# Patient Record
Sex: Male | Born: 1960 | Race: White | Hispanic: No | Marital: Married | Smoking: Never smoker
Health system: Southern US, Community
[De-identification: ages and names within clinical notes are randomized; demographics above are authoritative.]

## PROBLEM LIST (undated history)

## (undated) DIAGNOSIS — I10 Essential (primary) hypertension: Secondary | ICD-10-CM

---

## 2016-01-25 LAB — PULMONARY FUNCTION TEST

## 2018-02-18 LAB — PULMONARY FUNCTION TEST

## 2019-11-23 LAB — PULMONARY FUNCTION TEST

## 2019-12-30 ENCOUNTER — Other Ambulatory Visit: Payer: Self-pay

## 2019-12-30 ENCOUNTER — Encounter (HOSPITAL_BASED_OUTPATIENT_CLINIC_OR_DEPARTMENT_OTHER): Payer: Self-pay | Admitting: Emergency Medicine

## 2019-12-30 ENCOUNTER — Emergency Department (HOSPITAL_BASED_OUTPATIENT_CLINIC_OR_DEPARTMENT_OTHER)
Admission: EM | Admit: 2019-12-30 | Discharge: 2019-12-31 | Disposition: A | Discharge status: Home / Self Care | Payer: 59 | Attending: Emergency Medicine | Admitting: Emergency Medicine

## 2019-12-30 DIAGNOSIS — S51851A Open bite of right forearm, initial encounter: Secondary | ICD-10-CM | POA: Insufficient documentation

## 2019-12-30 DIAGNOSIS — S61451A Open bite of right hand, initial encounter: Secondary | ICD-10-CM | POA: Diagnosis not present

## 2019-12-30 DIAGNOSIS — Z23 Encounter for immunization: Secondary | ICD-10-CM | POA: Insufficient documentation

## 2019-12-30 DIAGNOSIS — W540XXA Bitten by dog, initial encounter: Secondary | ICD-10-CM | POA: Diagnosis not present

## 2019-12-30 DIAGNOSIS — Z2914 Encounter for prophylactic rabies immune globin: Secondary | ICD-10-CM | POA: Diagnosis not present

## 2019-12-30 MED ORDER — AMOXICILLIN-POT CLAVULANATE 875-125 MG PO TABS
1.0000 | ORAL_TABLET | Freq: Once | ORAL | Status: AC
  Administered 2019-12-30: 1 via ORAL
  Filled 2019-12-30: qty 1

## 2019-12-30 MED ORDER — LIDOCAINE-EPINEPHRINE 2 %-1:100000 IJ SOLN: 20.0000 mL | Freq: Once | INTRAMUSCULAR | Status: DC

## 2019-12-30 MED ORDER — RABIES VACCINE, PCEC IM SUSR
1.0000 mL | Freq: Once | INTRAMUSCULAR | Status: AC
  Administered 2019-12-30: 1 mL via INTRAMUSCULAR
  Filled 2019-12-30: qty 1

## 2019-12-30 MED ORDER — LIDOCAINE-EPINEPHRINE (PF) 2 %-1:200000 IJ SOLN
INTRAMUSCULAR | Status: AC
  Administered 2019-12-30: 20 mL
  Filled 2019-12-30: qty 20

## 2019-12-30 MED ORDER — RABIES IMMUNE GLOBULIN 150 UNIT/ML IM INJ
20.0000 [IU]/kg | INJECTION | Freq: Once | INTRAMUSCULAR | Status: AC
  Administered 2019-12-30: 1725 [IU] via INTRAMUSCULAR
  Filled 2019-12-30: qty 12

## 2019-12-30 MED ORDER — TETANUS-DIPHTH-ACELL PERTUSSIS 5-2.5-18.5 LF-MCG/0.5 IM SUSP
0.5000 mL | Freq: Once | INTRAMUSCULAR | Status: AC
  Administered 2019-12-30: 0.5 mL via INTRAMUSCULAR
  Filled 2019-12-30: qty 0.5

## 2019-12-30 NOTE — ED Notes (Signed)
Pt moved to FT, son stated he was c/o dizziness. Pt assisted to FT stretcher and VS checked. BP readings 80s/50s. Manual BP taken 84/56, HR 65. Pt moved to TX room and laid flat. Bandage with minimal bleed though.

## 2019-12-30 NOTE — ED Triage Notes (Addendum)
Pt was bit on RFA and wrist by his own dog while trying to give it medicine ~1700. Wound wrapped by his son. Lac to anterior wrist that is oozing, puncture wound to posterior FA that is not bleeding. Wound rewrapped with telfa and kerlex. Pt able to close fist and move all fingers. Unsure of last tetanus. Dog has had some rabies vaccine but unsure if he is UTD.

## 2019-12-31 MED ORDER — AMOXICILLIN-POT CLAVULANATE 875-125 MG PO TABS: 1.0000 | ORAL_TABLET | Freq: Two times a day (BID) | ORAL | 0 refills | Status: DC

## 2019-12-31 NOTE — ED Provider Notes (Signed)
[facility] EMERGENCY DEPARTMENT Provider Note   CSN: [id number] Arrival date & time: 12/30/19  1851     History Chief Complaint  Patient presents with  . Animal Bite    Alan Rodriguez is a 59 y.o. male.  HPI     This is a 59 year old male who presents with a dog bite to the right arm and hand.  He is right-handed.  He reports that the dog is his own.  He was giving him flea prevention when the dog bit him.  He does report that the dog is mostly outside.  They are unsure if he is up-to-date on his rabies vaccination.  His collar says 2020.  They report that they noted that the dog was vomiting.  They request rabies prophylaxis.  Unknown last tetanus shot.  He denies any significant pain in his hand or arm.  History reviewed. No pertinent past medical history.  There are no problems to display for this patient.   History reviewed. No pertinent surgical history.     No family history on file.  Social History   Tobacco Use  . Smoking status: Never Smoker  . Smokeless tobacco: Never Used  Substance Use Topics  . Alcohol use: Yes    Comment: once a week  . Drug use: Never    Home Medications Prior to Admission medications   Medication Sig Start Date End Date Taking? Authorizing Provider  lisinopril (ZESTRIL) 20 MG tablet Take 20 mg by mouth daily.   Yes [provider]  amoxicillin-clavulanate (AUGMENTIN) 875-125 MG tablet Take 1 tablet by mouth every 12 (twelve) hours. 12/31/19   Tayven Renteria, Mayer Masker, MD    Allergies    Patient has no known allergies.  Review of Systems   Review of Systems  Constitutional: Negative for fever.  Skin: Positive for wound. Negative for color change.    Physical Exam Updated Vital Signs BP (!) 142/79   Pulse 69   Temp 98.9 F (37.2 C) (Oral)   Resp (!) 24   Ht 1.753 m (5\' 9" )   Wt 86.2 kg   SpO2 95%   BMI 28.06 kg/m   Physical Exam Vitals and nursing note reviewed.  Constitutional:      Appearance: He  is well-developed. He is not ill-appearing.  HENT:     Head: Normocephalic and atraumatic.     Mouth/Throat:     Mouth: Mucous membranes are moist.  Eyes:     Pupils: Pupils are equal, round, and reactive to light.  Cardiovascular:     Rate and Rhythm: Normal rate and regular rhythm.  Pulmonary:     Effort: Pulmonary effort is normal. No respiratory distress.     Breath sounds: Normal breath sounds. No wheezing.  Musculoskeletal:     Cervical back: Neck supple.     Comments: Focused examination of the right hand and forearm without obvious deformity, multiple less than 1 cm puncture wounds over the forearm, there is a larger 4 cm laceration over the radial aspect of the wrist on the palmar side, slight oozing noted, mildly gaping, ulnar, median, and radial nerves intact, flexion and extension intact of all 5 digits, 2+ radial pulse  Skin:    General: Skin is warm and dry.  Neurological:     Mental Status: He is alert and oriented to person, place, and time.  Psychiatric:        Mood and Affect: Mood normal.     ED Results / Procedures /  Treatments   Labs (all labs ordered are listed, but only abnormal results are displayed) Labs Reviewed - No data to display  EKG None  Radiology No results found.  Procedures .Marland KitchenLaceration Repair  Date/Time: 12/31/2019 1:13 AM Performed by: Shon Baton, MD Authorized by: Shon Baton, MD   Consent:    Consent obtained:  Verbal   Consent given by:  Patient   Risks discussed:  Infection and pain   Alternatives discussed:  No treatment Anesthesia (see MAR for exact dosages):    Anesthesia method:  Local infiltration   Local anesthetic:  Lidocaine 1% WITH epi Laceration details:    Location:  Hand   Hand location:  R wrist   Length (cm):  4   Depth (mm):  5 Repair type:    Repair type:  Simple Pre-procedure details:    Preparation:  Patient was prepped and draped in usual sterile fashion Exploration:    Hemostasis  achieved with:  Direct pressure   Wound exploration: wound explored through full range of motion     Wound extent: no muscle damage noted, no nerve damage noted, no tendon damage noted, no underlying fracture noted and no vascular damage noted   Treatment:    Area cleansed with:  Betadine   Amount of cleaning:  Standard   Irrigation solution:  Sterile saline   Irrigation volume:  1000   Irrigation method:  Pressure wash   Visualized foreign bodies/material removed: no   Skin repair:    Repair method:  Sutures   Suture size:  4-0   Suture material:  Nylon   Suture technique:  Horizontal mattress   Number of sutures:  1 Approximation:    Approximation:  Loose Post-procedure details:    Dressing:  Open (no dressing)   Patient tolerance of procedure:  Tolerated well, no immediate complications Comments:     Wound was approximated loosely to help with hemostasis,   (including critical care time)  Medications Ordered in ED Medications  lidocaine-EPINEPHrine (XYLOCAINE W/EPI) 2 %-1:100000 (with pres) injection 20 mL (20 mLs Infiltration Not Given 12/30/19 2347)  amoxicillin-clavulanate (AUGMENTIN) 875-125 MG per tablet 1 tablet (1 tablet Oral Given 12/30/19 2333)  Tdap (BOOSTRIX) injection 0.5 mL (0.5 mLs Intramuscular Given 12/30/19 2333)  rabies vaccine (RABAVERT) injection 1 mL (1 mL Intramuscular Given 12/30/19 2334)  rabies immune globulin (HYPERAB/KEDRAB) injection 1,725 Units (1,725 Units Intramuscular Given 12/30/19 2336)  lidocaine-EPINEPHrine (XYLOCAINE W/EPI) 2 %-1:200000 (PF) injection (20 mLs  Given by Other 12/30/19 2339)    ED Course  I have reviewed the triage vital signs and the nursing notes.  Pertinent labs & imaging results that were available during my care of the patient were reviewed by me and considered in my medical decision making (see chart for details).    MDM Rules/Calculators/A&P                           Patient presents with a dog bite to the hand.  He  is overall nontoxic and vital signs are reassuring.  Multiple small puncture wounds and one larger laceration over the wrist.  He is neurovascularly intact.  He is requesting rabies prophylaxis.  We discussed ongoing vaccinations and quarantine the animal for signs and symptoms of rabies.  They stated understanding.  Tetanus was updated.  He was given a dose of Augmentin.  He will be discharged on Augmentin.  Larger laceration was loosely approximated to help with hemostasis.  He was given precautions regarding signs and symptoms of infection.  After history, exam, and medical workup I feel the patient has been appropriately medically screened and is safe for discharge home. Pertinent diagnoses were discussed with the patient. Patient was given return precautions.   Final Clinical Impression(s) / ED Diagnoses Final diagnoses:  Dog bite, initial encounter    Rx / DC Orders ED Discharge Orders         Ordered    amoxicillin-clavulanate (AUGMENTIN) 875-125 MG tablet  Every 12 hours        12/31/19 0110           Zareen Jamison, Mayer Masker, MD 12/31/19 [phone]

## 2019-12-31 NOTE — Discharge Instructions (Addendum)
You are seen today for an animal bite.  You were given tetanus and rabies vaccination and immunoglobulin.  Follow-up with your primary doctor for wound check this week.  You will need to have the stitch removed in 5 to 7 days.  Monitor closely for signs and symptoms of infection.  Take medications as prescribed.  See scheduling for further rabies vaccination provided by nursing.  If the dog can be quarantined and observed for 10 days, you may avoid additional vaccination.

## 2020-01-02 ENCOUNTER — Encounter: Payer: Self-pay | Admitting: *Deleted

## 2020-01-02 ENCOUNTER — Other Ambulatory Visit: Payer: Self-pay

## 2020-01-02 ENCOUNTER — Emergency Department
Admission: EM | Admit: 2020-01-02 | Discharge: 2020-01-02 | Disposition: A | Discharge status: Home / Self Care | Payer: 59 | Source: Home / Self Care

## 2020-01-02 DIAGNOSIS — Z23 Encounter for immunization: Secondary | ICD-10-CM

## 2020-01-02 HISTORY — DX: Essential (primary) hypertension: I10

## 2020-01-02 MED ORDER — RABIES VACCINE, PCEC IM SUSR
1.0000 mL | Freq: Once | INTRAMUSCULAR | Status: AC
  Administered 2020-01-02: 1 mL via INTRAMUSCULAR

## 2020-01-02 NOTE — ED Triage Notes (Signed)
Pt is here today for his 2nd rabies vaccine post ED visit on 12/30/19.

## 2020-01-03 ENCOUNTER — Encounter: Payer: Self-pay | Admitting: Family Medicine

## 2020-01-03 ENCOUNTER — Ambulatory Visit (INDEPENDENT_AMBULATORY_CARE_PROVIDER_SITE_OTHER): Payer: 59 | Admitting: Family Medicine

## 2020-01-03 VITALS — BP 136/65 | HR 65 | Ht 70.0 in | Wt 196.0 lb

## 2020-01-03 DIAGNOSIS — S61511A Laceration without foreign body of right wrist, initial encounter: Secondary | ICD-10-CM | POA: Diagnosis not present

## 2020-01-03 DIAGNOSIS — E785 Hyperlipidemia, unspecified: Secondary | ICD-10-CM | POA: Diagnosis not present

## 2020-01-03 DIAGNOSIS — Z833 Family history of diabetes mellitus: Secondary | ICD-10-CM

## 2020-01-03 DIAGNOSIS — I10 Essential (primary) hypertension: Secondary | ICD-10-CM | POA: Diagnosis not present

## 2020-01-03 DIAGNOSIS — W540XXA Bitten by dog, initial encounter: Secondary | ICD-10-CM | POA: Insufficient documentation

## 2020-01-03 NOTE — Assessment & Plan Note (Signed)
BP little elevated today did encourage him to take the blood pressure pill regularly and if he has any side effects let me know.  I suspect we may need to actually decrease his dose if he is taking it consistently.

## 2020-01-03 NOTE — Assessment & Plan Note (Addendum)
Alan Rodriguez has significant swelling over the right forearm but most of the wounds are healing well.  Recommend switch to Vaseline instead of bacitracin ointment.  Come back in about 6 days for suture removal for the laceration at the wrist.  He Alan Rodriguez complete his antibiotics over the weekends if at any point he notices increased swelling or redness after completing the antibiotics please give [place] call back.  K to keep uncovered and was working outside or could possibly get dirty in which case he Alan Rodriguez need to cover it temporarily.

## 2020-01-03 NOTE — Progress Notes (Addendum)
New Patient Office Visit  Subjective:  Patient ID: Alan Rodriguez, male    DOB: 09-30-1960  Age: 59 y.o. MRN: [id number]  CC:  Chief Complaint  Patient presents with  . Establish Care    HPI Alan Rodriguez presents for follow-up wound care for dog bite on his right arm and hand.  It occurred on 10 3 requiring irrigation and sutures.  No significant injury to the tendons nerves.  He is still on the Augmentin and has a few days left.  He is not having any active drainage from the wounds.  He has been cleaning it with antibacterial soap and then applying bacitracin ointment.  He is also transferring care here from Alan Rodriguez.  He has a history of hypertension but admits he really does not take his blood pressure pill regularly.  He tries to do dietary measures to improve his blood pressure though he has cut back on salt.  He reports that his home blood pressures have actually looked pretty good with his home monitor.  History of hyperlipidemia-not currently on a statin.  He just says he had labs done recently so we Alan Rodriguez get those transferred medical records and get those entered into the system.  Had elevated cholesterol for years.  Family history of diabetes in both parents.  He says he does get screened periodically for diabetes.  Past Medical History:  Diagnosis Date  . Hypertension     History reviewed. No pertinent surgical history.  Family History  Problem Relation Age of Onset  . Diabetes Mother   . Diabetes Father     Social History   Socioeconomic History  . Marital status: Married    Spouse name: Not on file  . Number of children: Not on file  . Years of education: Not on file  . Highest education level: Not on file  Occupational History  . Not on file  Tobacco Use  . Smoking status: Never Smoker  . Smokeless tobacco: Never Used  Vaping Use  . Vaping Use: Never used  Substance and Sexual Activity  . Alcohol use: Yes    Comment: once a week  .  Drug use: Never  . Sexual activity: Not on file  Other Topics Concern  . Not on file  Social History Narrative  . Not on file   Social Determinants of Health   Financial Resource Strain:   . Difficulty of Paying Living Expenses: Not on file  Food Insecurity:   . Worried About Alan Rodriguez: Not on file  . Ran Out of Food in the Last Rodriguez: Not on file  Transportation Needs:   . Lack of Transportation (Medical): Not on file  . Lack of Transportation (Non-Medical): Not on file  Physical Activity:   . Days of Exercise per Week: Not on file  . Minutes of Exercise per Session: Not on file  Stress:   . Feeling of Stress : Not on file  Social Connections:   . Frequency of Communication with Friends and Family: Not on file  . Frequency of Social Gatherings with Friends and Family: Not on file  . Attends Religious Services: Not on file  . Active Member of Clubs or Organizations: Not on file  . Attends Banker Meetings: Not on file  . Marital Status: Not on file  Intimate Partner Violence:   . Fear of Current or Ex-Partner: Not on file  . Emotionally Abused: Not on file  .  Physically Abused: Not on file  . Sexually Abused: Not on file    ROS Review of Systems  Constitutional: Negative for diaphoresis, fever and unexpected weight change.  HENT: Negative for hearing loss, rhinorrhea, sneezing and tinnitus.   Eyes: Negative for visual disturbance.  Respiratory: Negative for cough and wheezing.   Cardiovascular: Negative for chest pain and palpitations.  Gastrointestinal: Negative for blood in stool, diarrhea, nausea and vomiting.  Genitourinary: Negative for discharge and dysuria.  Musculoskeletal: Negative for arthralgias and myalgias.  Skin: Negative for rash.  Neurological: Negative for headaches.  Hematological: Negative for adenopathy.  Psychiatric/Behavioral: Negative for dysphoric mood and sleep disturbance. The patient is not  nervous/anxious.     Objective:   Today's Vitals: BP 136/65   Pulse 65   Ht 5\' 10"  (1.778 m)   Wt 196 lb (88.9 kg)   SpO2 99%   BMI 28.12 kg/m   Physical Exam Constitutional:      Appearance: He is well-developed.  HENT:     Head: Normocephalic and atraumatic.  Cardiovascular:     Rate and Rhythm: Normal rate and regular rhythm.     Heart sounds: Normal heart sounds.  Pulmonary:     Effort: Pulmonary effort is normal.     Breath sounds: Normal breath sounds.  Skin:    General: Skin is warm and dry.  Neurological:     Mental Status: He is alert and oriented to person, place, and time.  Psychiatric:        Behavior: Behavior normal.     Assessment & Plan:   Problem List Items Addressed This Visit      Cardiovascular and Mediastinum   Essential hypertension    BP little elevated today did encourage him to take the blood pressure pill regularly and if he has any side effects let me know.  I suspect we may need to actually decrease his dose if he is taking it consistently.        Other   Hyperlipidemia    Alan Rodriguez get copy of lipids.        Family history of diabetes mellitus in first degree relative   Dog bite - Primary    Alan Rodriguez has significant swelling over the right forearm but most of the wounds are healing well.  Recommend switch to Vaseline instead of bacitracin ointment.  Come back in about 6 days for suture removal for the laceration at the wrist.  He Alan Rodriguez complete his antibiotics over the weekends if at any point he notices increased swelling or redness after completing the antibiotics please give call back.  Alan Rodriguez to keep uncovered and was working outside or could possibly get dirty in which case he Alan Rodriguez need to cover it temporarily.       Other Visit Diagnoses    Laceration of right wrist, initial encounter          Outpatient Encounter Medications as of 01/03/2020  Medication Sig  . [DISCONTINUED] amoxicillin-clavulanate (AUGMENTIN) 875-125 MG tablet Take  1 tablet by mouth every 12 (twelve) hours.  . [DISCONTINUED] lisinopril (ZESTRIL) 20 MG tablet Take 20 mg by mouth daily.   No facility-administered encounter medications on file as of 01/03/2020.    Declines flu vaccine today.  Follow-up: Return in about 6 days (around 01/09/2020) for Suture removal only.   01/11/2020, MD

## 2020-01-03 NOTE — Assessment & Plan Note (Signed)
Will get copy of lipids.

## 2020-01-05 ENCOUNTER — Other Ambulatory Visit: Payer: Self-pay

## 2020-01-05 ENCOUNTER — Emergency Department (INDEPENDENT_AMBULATORY_CARE_PROVIDER_SITE_OTHER)
Admission: EM | Admit: 2020-01-05 | Discharge: 2020-01-05 | Disposition: A | Discharge status: Home / Self Care | Payer: 59 | Source: Home / Self Care

## 2020-01-05 ENCOUNTER — Encounter: Payer: Self-pay | Admitting: Emergency Medicine

## 2020-01-05 DIAGNOSIS — Z23 Encounter for immunization: Secondary | ICD-10-CM

## 2020-01-05 MED ORDER — RABIES VACCINE, PCEC IM SUSR
1.0000 mL | Freq: Once | INTRAMUSCULAR | Status: AC
  Administered 2020-01-05: 1 mL via INTRAMUSCULAR

## 2020-01-05 NOTE — ED Triage Notes (Signed)
Patient bitten by his own dog 10/3/211 on right lower arm; here for 2nd post ER visit rabies vaccination. States no problem with other doses. Is on antibiotics and was seen by PCP for wound evaluation earlier this week.

## 2020-01-10 ENCOUNTER — Encounter: Payer: Self-pay | Admitting: Family Medicine

## 2020-01-10 ENCOUNTER — Other Ambulatory Visit: Payer: Self-pay

## 2020-01-10 ENCOUNTER — Ambulatory Visit (INDEPENDENT_AMBULATORY_CARE_PROVIDER_SITE_OTHER): Payer: 59 | Admitting: Family Medicine

## 2020-01-10 VITALS — BP 127/73 | HR 61 | Ht 70.0 in | Wt 194.0 lb

## 2020-01-10 DIAGNOSIS — I10 Essential (primary) hypertension: Secondary | ICD-10-CM | POA: Diagnosis not present

## 2020-01-10 DIAGNOSIS — W540XXD Bitten by dog, subsequent encounter: Secondary | ICD-10-CM

## 2020-01-10 MED ORDER — LISINOPRIL 10 MG PO TABS: 10.0000 mg | ORAL_TABLET | Freq: Every day | ORAL | 1 refills | Status: DC

## 2020-01-10 NOTE — Assessment & Plan Note (Signed)
Wound is healing well.  Suture removed easily.  For swelling on forearm, will schedule [place] next week if not improving.  Still tender but no active drainge.

## 2020-01-10 NOTE — Progress Notes (Signed)
Acute Office Visit  Subjective:    Patient ID: Alan Rodriguez, male    DOB: [birth year], 59 y.o.   MRN: [id number]  No chief complaint on file.   HPI Patient is in today for f/u HTN.  Did start half a tab of 20 mg dailly . Tolerating well with no side effects.    Here for suture removal. He is doing well. No drainage from the wound.  Has a suture to remove.  The swelling on the dorsum of the forearm has been getting better but still some swollen. Some pain in this area with full flexion of the wrist.   Past Medical History:  Diagnosis Date  . Hypertension     No past surgical history on file.  No family history on file.  Social History   Socioeconomic History  . Marital status: Married    Spouse name: Not on file  . Number of children: Not on file  . Years of education: Not on file  . Highest education level: Not on file  Occupational History  . Not on file  Tobacco Use  . Smoking status: Never Smoker  . Smokeless tobacco: Never Used  Vaping Use  . Vaping Use: Never used  Substance and Sexual Activity  . Alcohol use: Yes    Comment: once a week  . Drug use: Never  . Sexual activity: Not on file  Other Topics Concern  . Not on file  Social History Narrative  . Not on file   Social Determinants of Health   Financial Resource Strain:   . Difficulty of Paying Living Expenses: Not on file  Food Insecurity:   . Worried About Charity fundraiser in the Last Year: Not on file  . Ran Out of Food in the Last Year: Not on file  Transportation Needs:   . Lack of Transportation (Medical): Not on file  . Lack of Transportation (Non-Medical): Not on file  Physical Activity:   . Days of Exercise per Week: Not on file  . Minutes of Exercise per Session: Not on file  Stress:   . Feeling of Stress : Not on file  Social Connections:   . Frequency of Communication with Friends and Family: Not on file  . Frequency of Social Gatherings with Friends and Family: Not on  file  . Attends Religious Services: Not on file  . Active Member of Clubs or Organizations: Not on file  . Attends Archivist Meetings: Not on file  . Marital Status: Not on file  Intimate Partner Violence:   . Fear of Current or Ex-Partner: Not on file  . Emotionally Abused: Not on file  . Physically Abused: Not on file  . Sexually Abused: Not on file    Outpatient Medications Prior to Visit  Medication Sig Dispense Refill  . amoxicillin-clavulanate (AUGMENTIN) 875-125 MG tablet Take 1 tablet by mouth every 12 (twelve) hours. 14 tablet 0  . lisinopril (ZESTRIL) 20 MG tablet Take 20 mg by mouth daily.     No facility-administered medications prior to visit.    No Known Allergies  Review of Systems     Objective:    Physical Exam Constitutional:      Appearance: He is well-developed.  HENT:     Head: Normocephalic and atraumatic.  Cardiovascular:     Rate and Rhythm: Normal rate and regular rhythm.     Heart sounds: Normal heart sounds.  Pulmonary:     Effort: Pulmonary effort is  normal.     Breath sounds: Normal breath sounds.  Skin:    General: Skin is warm and dry.  Neurological:     Mental Status: He is alert and oriented to person, place, and time.  Psychiatric:        Behavior: Behavior normal.     BP 127/73   Pulse 61   Ht 5' 10" (1.778 m)   Wt 194 lb (88 kg)   BMI 27.84 kg/m  Wt Readings from Last 3 Encounters:  01/10/20 194 lb (88 kg)  01/03/20 196 lb (88.9 kg)  01/02/20 190 lb (86.2 kg)    Health Maintenance Due  Topic Date Due  . Hepatitis C Screening  Never done  . HIV Screening  Never done    There are no preventive care reminders to display for this patient.   No results found for: TSH No results found for: WBC, HGB, HCT, MCV, PLT No results found for: NA, K, CHLORIDE, CO2, GLUCOSE, BUN, CREATININE, BILITOT, ALKPHOS, AST, ALT, PROT, ALBUMIN, CALCIUM, ANIONGAP, EGFR, GFR No results found for: CHOL No results found for:  HDL No results found for: LDLCALC No results found for: TRIG No results found for: CHOLHDL No results found for: HGBA1C     Assessment & Plan:   Problem List Items Addressed This Visit      Cardiovascular and Mediastinum   Essential hypertension - Primary    Well controlled. Continue current regimen. Follow up in  6 months. Will verify labs once get medical records.       Relevant Medications   lisinopril (ZESTRIL) 10 MG tablet     Other   Dog bite    Wound is healing well.  Suture removed easily.  For swelling on forearm, will schedule [place] next week if not improving.  Still tender but no active drainge.            Meds ordered this encounter  Medications  . DISCONTD: lisinopril (ZESTRIL) 10 MG tablet    Sig: Take 1 tablet (10 mg total) by mouth daily.    Dispense:  90 tablet    Refill:  1  . lisinopril (ZESTRIL) 10 MG tablet    Sig: Take 1 tablet (10 mg total) by mouth daily.    Dispense:  90 tablet    Refill:  1     Beatrice Lecher, MD

## 2020-01-10 NOTE — Assessment & Plan Note (Signed)
Well controlled. Continue current regimen. Follow up in  6 months. Will verify labs once get medical records.

## 2020-01-12 ENCOUNTER — Other Ambulatory Visit: Payer: Self-pay

## 2020-01-12 ENCOUNTER — Emergency Department (INDEPENDENT_AMBULATORY_CARE_PROVIDER_SITE_OTHER)
Admission: EM | Admit: 2020-01-12 | Discharge: 2020-01-12 | Disposition: A | Discharge status: Home / Self Care | Payer: 59 | Source: Home / Self Care

## 2020-01-12 DIAGNOSIS — Z23 Encounter for immunization: Secondary | ICD-10-CM

## 2020-01-12 MED ORDER — RABIES VACCINE, PCEC IM SUSR
1.0000 mL | Freq: Once | INTRAMUSCULAR | Status: AC
  Administered 2020-01-12: 1 mL via INTRAMUSCULAR

## 2020-01-12 NOTE — ED Triage Notes (Signed)
Here for 4th in series of rabies shot - given in left arm - pt denies adverse reactions w/ other rabies series

## 2020-01-26 ENCOUNTER — Emergency Department (INDEPENDENT_AMBULATORY_CARE_PROVIDER_SITE_OTHER)
Admission: EM | Admit: 2020-01-26 | Discharge: 2020-01-26 | Disposition: A | Discharge status: Home / Self Care | Payer: 59 | Source: Home / Self Care

## 2020-01-26 DIAGNOSIS — Z23 Encounter for immunization: Secondary | ICD-10-CM

## 2020-01-26 MED ORDER — RABIES VACCINE, PCEC IM SUSR
1.0000 mL | Freq: Once | INTRAMUSCULAR | Status: AC
  Administered 2020-01-26: 1 mL via INTRAMUSCULAR

## 2020-01-26 NOTE — ED Triage Notes (Signed)
Here for last in series of rabies injection

## 2020-05-30 ENCOUNTER — Telehealth: Payer: Self-pay | Admitting: Family Medicine

## 2020-05-30 NOTE — Telephone Encounter (Signed)
Please call patient to see where he may have had the colonoscopy performed.  The report says that it was done in 2016 but I do not see it in care everywhere and will need to try to get a copy of that if possible.

## 2020-05-30 NOTE — Telephone Encounter (Signed)
Called pt, he stated that he had the colonoscopy done in [place] and cannot remember the Dr or facility name. He stated when he gets home he will look for the documents regarding the colonoscopy and let us know.

## 2020-07-11 ENCOUNTER — Ambulatory Visit: Payer: 59 | Admitting: Family Medicine

## 2020-07-18 ENCOUNTER — Ambulatory Visit: Payer: 59 | Admitting: Family Medicine

## 2020-12-26 ENCOUNTER — Encounter: Payer: 59 | Admitting: Family Medicine

## 2020-12-26 ENCOUNTER — Ambulatory Visit (INDEPENDENT_AMBULATORY_CARE_PROVIDER_SITE_OTHER): Payer: 59 | Admitting: Family Medicine

## 2020-12-26 ENCOUNTER — Other Ambulatory Visit: Payer: Self-pay

## 2020-12-26 ENCOUNTER — Encounter: Payer: Self-pay | Admitting: Family Medicine

## 2020-12-26 VITALS — BP 115/89 | HR 66 | Temp 98.5°F | Wt 200.0 lb

## 2020-12-26 DIAGNOSIS — E782 Mixed hyperlipidemia: Secondary | ICD-10-CM

## 2020-12-26 DIAGNOSIS — Z0000 Encounter for general adult medical examination without abnormal findings: Secondary | ICD-10-CM | POA: Diagnosis not present

## 2020-12-26 DIAGNOSIS — Z1211 Encounter for screening for malignant neoplasm of colon: Secondary | ICD-10-CM | POA: Diagnosis not present

## 2020-12-26 DIAGNOSIS — I10 Essential (primary) hypertension: Secondary | ICD-10-CM | POA: Diagnosis not present

## 2020-12-26 MED ORDER — LISINOPRIL 10 MG PO TABS: 10.0000 mg | ORAL_TABLET | Freq: Every day | ORAL | 1 refills | Status: DC

## 2020-12-26 NOTE — Patient Instructions (Signed)
-   BP goal <130/80 - monitor and log blood pressures at home - check around the same time each day in a relaxed setting - Limit salt to <2000 mg/day - Follow DASH eating plan (heart healthy diet) - limit alcohol to 2 standard drinks per day for men and 1 per day for women - avoid tobacco products - get at least 2 hours of regular aerobic exercise weekly Patient aware of signs/symptoms requiring further/urgent evaluation. Labs updated today.  

## 2020-12-26 NOTE — Progress Notes (Signed)
BP 115/89 (BP Location: Left Arm, Patient Position: Sitting, Cuff Size: Normal)   Pulse 66   Temp 98.5 F (36.9 C) (Oral)   Wt 200 lb 0.6 oz (90.7 kg)   BMI 28.70 kg/m    Subjective:    Patient ID: Alan Rodriguez, male    DOB: October 24, 1960, 60 y.o.   MRN: [id number]  HPI: Alan Rodriguez is a 60 y.o. male presenting on 12/26/2020 for comprehensive medical examination. Current medical complaints include:none  He currently lives with: wife Interim Problems from his last visit: no  He reports regular vision exams q1-5y: yes He reports regular dental exams q 46m: yes His diet consists of: home cooking, fruits, vegetables He endorses exercise and/or activity of: walking, house work  He works at: [employer]  He endorses ETOH use - 3-4 servings a week He denies nictoine use  He denies illegal substance use   He is currently sexually active with wife He denies concerns today about STI  He denies concerns about skin changes today:  He denies concerns about bowel changes today:  He denies concerns about bladder changes today:   Depression Screen done today and results listed below:  Depression screen [facility] 2/9 12/26/2020 01/03/2020  Decreased Interest 0 0  Down, Depressed, Hopeless 0 0  PHQ - 2 Score 0 0    The patient does not have a history of falls.     Past Medical History:  Past Medical History:  Diagnosis Date   Hypertension     Surgical History:  History reviewed. No pertinent surgical history.  Medications:  No current outpatient medications on file prior to visit.   No current facility-administered medications on file prior to visit.    Allergies:  Allergies  Allergen Reactions   Orange Peel [Orange Oil] Dermatitis    Social History:  Social History   Socioeconomic History   Marital status: Married    Spouse name: Not on file   Number of children: Not on file   Years of education: Not on file   Highest education level: Not on file   Occupational History   Not on file  Tobacco Use   Smoking status: Never   Smokeless tobacco: Never  Vaping Use   Vaping Use: Never used  Substance and Sexual Activity   Alcohol use: Yes    Comment: once a week   Drug use: Never   Sexual activity: Not on file  Other Topics Concern   Not on file  Social History Narrative   Not on file   Social Determinants of Health   Financial Resource Strain: Not on file  Food Insecurity: Not on file  Transportation Needs: Not on file  Physical Activity: Not on file  Stress: Not on file  Social Connections: Not on file  Intimate Partner Violence: Not on file   Social History   Tobacco Use  Smoking Status Never  Smokeless Tobacco Never   Social History   Substance and Sexual Activity  Alcohol Use Yes   Comment: once a week    Family History:  Family History  Problem Relation Age of Onset   Diabetes Mother    Diabetes Father     Past medical history, surgical history, medications, allergies, family history and social history reviewed with patient today and changes made to appropriate areas of the chart.   All ROS negative except what is listed above and in the HPI.      Objective:    BP 115/89 (  BP Location: Left Arm, Patient Position: Sitting, Cuff Size: Normal)   Pulse 66   Temp 98.5 F (36.9 C) (Oral)   Wt 200 lb 0.6 oz (90.7 kg)   BMI 28.70 kg/m   Wt Readings from Last 3 Encounters:  12/26/20 200 lb 0.6 oz (90.7 kg)  01/10/20 194 lb (88 kg)  01/03/20 196 lb (88.9 kg)    Physical Exam Vitals reviewed.  Constitutional:      Appearance: Normal appearance. He is normal weight.  HENT:     Head: Normocephalic and atraumatic.     Right Ear: Tympanic membrane normal.     Left Ear: Tympanic membrane normal.     Nose: Nose normal.     Mouth/Throat:     Mouth: Mucous membranes are moist.     Pharynx: Oropharynx is clear. No oropharyngeal exudate or posterior oropharyngeal erythema.  Cardiovascular:     Rate and  Rhythm: Normal rate and regular rhythm.     Pulses: Normal pulses.     Heart sounds: Normal heart sounds.  Pulmonary:     Effort: Pulmonary effort is normal.     Breath sounds: Normal breath sounds.  Abdominal:     General: Bowel sounds are normal.     Palpations: Abdomen is soft.  Musculoskeletal:        General: Normal range of motion.     Cervical back: Normal range of motion and neck supple.     Right lower leg: No edema.     Left lower leg: No edema.  Skin:    General: Skin is warm and dry.     Capillary Refill: Capillary refill takes less than 2 seconds.     Findings: No rash.  Neurological:     Mental Status: He is alert and oriented to person, place, and time. Mental status is at baseline.  Psychiatric:        Mood and Affect: Mood normal.        Behavior: Behavior normal.        Thought Content: Thought content normal.        Judgment: Judgment normal.    Results for orders placed or performed in visit on 11/23/19  Pulmonary Function Test  Result Value Ref Range   FEV1     FVC     FEV1/FVC     TLC     DLCO        Assessment & Plan:   Problem List Items Addressed This Visit       Cardiovascular and Mediastinum   Essential hypertension    Reports he has not been taking his lisinopril every day, only when BP is high. Home readings are typically 130/80s. Last reading today was 155/89 Blood pressure is not at goal at for age and co-morbidities.  I recommend taking lisinopril daily as prescribed.  In addition they were instructed on the following: - BP goal <130/80 - monitor and log blood pressures at home - check around the same time each day in a relaxed setting - Limit salt to <2000 mg/day - Follow DASH eating plan (heart healthy diet) - limit alcohol to 2 standard drinks per day for men and 1 per day for women - avoid tobacco products - get at least 2 hours of regular aerobic exercise weekly Patient aware of signs/symptoms requiring further/urgent  evaluation. Labs updated today. **recommend nurse visit in 2 weeks for BP check**       Relevant Medications   lisinopril (ZESTRIL) 10 MG  tablet   Other Relevant Orders   CBC   COMPLETE METABOLIC PANEL WITH GFR   Lipid panel   Other Visit Diagnoses     Preventative health care    -  Primary   Relevant Orders   Cologuard   CBC   COMPLETE METABOLIC PANEL WITH GFR   Lipid panel   Screen for colon cancer       Relevant Orders   Cologuard        LABORATORY TESTING:  Health maintenance labs ordered today as discussed above.  - STI testing: deferred   IMMUNIZATIONS:   - Tdap: Tetanus vaccination status reviewed: tetanus status unknown to the patient. - Influenza: Refused - Pneumovax: Not applicable - Prevnar: Not applicable - HPV: Not applicable - Shingrix vaccine: Refused - COVID-19: Refused  SCREENING: - Colonoscopy: Ordered today - Cologuard Discussed with patient purpose of the colonoscopy is to detect colon cancer at curable precancerous or early stages  - AAA Screening: Not applicable  -Hearing Test: Not applicable  -Spirometry: Not applicable  - Lung Cancer Screening: Not applicable    PATIENT COUNSELING:    Sexuality: Discussed sexually transmitted diseases, partner selection, use of condoms, avoidance of unintended pregnancy, and contraceptive alternatives.    I discussed with the patient that most people either abstain from alcohol or drink within safe limits (<=14/week and <=4 drinks/occasion for males, <=7/weeks and <= 3 drinks/occasion for females) and that the risk for alcohol disorders and other health effects rises proportionally with the number of drinks per week and how often a drinker exceeds daily limits.  Discussed cessation/primary prevention of drug use and availability of treatment for abuse.   Diet: Encouraged to adjust caloric intake to maintain or achieve ideal body weight, to reduce intake of dietary saturated fat and total fat, to  limit sodium intake by avoiding high sodium foods and not adding table salt, and to maintain adequate dietary potassium and calcium preferably from fresh fruits, vegetables, and low-fat dairy products.    Emphasized the importance of regular exercise  Injury prevention: Discussed safety belts, safety helmets, smoke detector, smoking near bedding or upholstery.   Dental health: Discussed importance of regular tooth brushing, flossing, and dental visits.   Follow up plan:  Return in about 2 weeks (around 01/09/2021) for nurse visit BP check.   Lollie Marrow Reola Calkins, DNP, FNP-C

## 2020-12-26 NOTE — Assessment & Plan Note (Addendum)
Reports he has not been taking his lisinopril every day, only when BP is high. Home readings are typically 130/80s. Last reading today was 155/89 Blood pressure is not at goal at for age and co-morbidities.  I recommend taking lisinopril daily as prescribed.  In addition they were instructed on the following: - BP goal <130/80 - monitor and log blood pressures at home - check around the same time each day in a relaxed setting - Limit salt to <2000 mg/day - Follow DASH eating plan (heart healthy diet) - limit alcohol to 2 standard drinks per day for men and 1 per day for women - avoid tobacco products - get at least 2 hours of regular aerobic exercise weekly Patient aware of signs/symptoms requiring further/urgent evaluation. Labs updated today. **recommend nurse visit in 2 weeks for BP check**

## 2020-12-27 LAB — COMPLETE METABOLIC PANEL WITH GFR
AG Ratio: 2 (calc) (ref 1.0–2.5)
ALT: 29 U/L (ref 9–46)
AST: 29 U/L (ref 10–35)
Albumin: 4.8 g/dL (ref 3.6–5.1)
Alkaline phosphatase (APISO): 62 U/L (ref 35–144)
BUN: 19 mg/dL (ref 7–25)
CO2: 30 mmol/L (ref 20–32)
Calcium: 9.9 mg/dL (ref 8.6–10.3)
Chloride: 101 mmol/L (ref 98–110)
Creat: 0.95 mg/dL (ref 0.70–1.30)
Globulin: 2.4 g/dL (calc) (ref 1.9–3.7)
Glucose, Bld: 103 mg/dL — ABNORMAL HIGH (ref 65–99)
Potassium: 4.3 mmol/L (ref 3.5–5.3)
Sodium: 138 mmol/L (ref 135–146)
Total Bilirubin: 0.5 mg/dL (ref 0.2–1.2)
Total Protein: 7.2 g/dL (ref 6.1–8.1)
eGFR: 93 mL/min/{1.73_m2} (ref >=60)

## 2020-12-27 LAB — CBC
HCT: 47.8 % (ref 38.5–50.0)
Hemoglobin: 16.2 g/dL (ref 13.2–17.1)
MCH: 29.8 pg (ref 27.0–33.0)
MCHC: 33.9 g/dL (ref 32.0–36.0)
MCV: 87.9 fL (ref 80.0–100.0)
MPV: 11.1 fL (ref 7.5–12.5)
Platelets: 267 10*3/uL (ref 140–400)
RBC: 5.44 10*6/uL (ref 4.20–5.80)
RDW: 12 % (ref 11.0–15.0)
WBC: 5.3 10*3/uL (ref 3.8–10.8)

## 2020-12-27 LAB — LIPID PANEL
Cholesterol: 232 mg/dL — ABNORMAL HIGH (ref <200)
HDL: 54 mg/dL (ref >=40)
LDL Cholesterol (Calc): 140 mg/dL (calc) — ABNORMAL HIGH
Non-HDL Cholesterol (Calc): 178 mg/dL (calc) — ABNORMAL HIGH (ref <130)
Total CHOL/HDL Ratio: 4.3 (calc) (ref <5.0)
Triglycerides: 237 mg/dL — ABNORMAL HIGH (ref <150)

## 2020-12-29 NOTE — Addendum Note (Signed)
Addended by: Hyman Hopes B on: 12/29/2020 08:17 AM   Modules accepted: Orders

## 2021-01-02 ENCOUNTER — Encounter: Payer: 59 | Admitting: Family Medicine

## 2021-01-17 LAB — COLOGUARD: Cologuard: NEGATIVE

## 2021-02-10 ENCOUNTER — Other Ambulatory Visit: Payer: Self-pay

## 2021-02-10 DIAGNOSIS — I10 Essential (primary) hypertension: Secondary | ICD-10-CM

## 2021-02-10 MED ORDER — LISINOPRIL 10 MG PO TABS: 10.0000 mg | ORAL_TABLET | Freq: Every day | ORAL | 1 refills | Status: DC

## 2021-03-31 ENCOUNTER — Other Ambulatory Visit: Payer: Self-pay

## 2021-03-31 ENCOUNTER — Ambulatory Visit (INDEPENDENT_AMBULATORY_CARE_PROVIDER_SITE_OTHER): Payer: 59 | Admitting: Family Medicine

## 2021-03-31 ENCOUNTER — Encounter: Payer: Self-pay | Admitting: Family Medicine

## 2021-03-31 VITALS — BP 138/83 | HR 71 | Temp 98.8°F | Ht 70.0 in | Wt 198.1 lb

## 2021-03-31 DIAGNOSIS — R079 Chest pain, unspecified: Secondary | ICD-10-CM | POA: Diagnosis not present

## 2021-03-31 DIAGNOSIS — I10 Essential (primary) hypertension: Secondary | ICD-10-CM | POA: Diagnosis not present

## 2021-03-31 MED ORDER — LISINOPRIL 20 MG PO TABS: 20.0000 mg | ORAL_TABLET | Freq: Every day | ORAL | 0 refills | Status: DC

## 2021-03-31 NOTE — Patient Instructions (Addendum)
Increasing your lisinopril to 20 mg daily. It is important that you are taking this everyday as prescribed.  Adding a cardiologist referral to further workup your chest pain - they will call you to schedule. If chest pain becomes more severe or if more symptoms develop, go to the emergency department.

## 2021-03-31 NOTE — Progress Notes (Signed)
Acute Office Visit  Subjective:    Patient ID: Alan Rodriguez, male    DOB: [birth year], 61 y.o.   MRN: [id number]  Chief Complaint  Patient presents with   Hypertension    HPI Patient is in today for hospital follow-up: HTN, chest pain.   03/30/20 ED: Patient went to the ED with home blood pressure readings of 221/130 and chest tightness. Prescribed lisinopril 10 mg daily, but reported that if his BP reading is normal at home, then he may skip it. He ended up taking 20 mg of lisinopril when he saw his BP was this high. No headaches at the time.  CXR: negative  EKG: NSR, normal axis, T wave abnormality, consider inferior ischemia, nonspecific repolarization disturbance  CBC, CMP, Magnesium: unremarkable Troponin: negative x3   Patient reports he had stopped taking his lisinopril for the past month. Today he is feeling much better, but still having mild chest pressure - states it was 4/10 this morning, but slowly improving. He has been trying to lie around and take it easy today. States that the episode last night came on suddenly with no precipitation as he was getting into bed. States he had not had any previous chest pain, dyspnea, palpitations, etc. States that when he woke up this morning, his blood pressure was 180/100 so he took 20 mg of lisinopril, ate his breakfast and rested. BP came down to 117/77 prior to him coming in for this appointment. He denies any palpitations, tachycardia, dyspnea, dizziness, headaches, vision changes.       Past Medical History:  Diagnosis Date   Hypertension     History reviewed. No pertinent surgical history.  Family History  Problem Relation Age of Onset   Diabetes Mother    Diabetes Father     Social History   Socioeconomic History   Marital status: Married    Spouse name: Not on file   Number of children: Not on file   Years of education: Not on file   Highest education level: Not on file  Occupational History   Not on file   Tobacco Use   Smoking status: Never   Smokeless tobacco: Never  Vaping Use   Vaping Use: Never used  Substance and Sexual Activity   Alcohol use: Yes    Comment: once a week   Drug use: Never   Sexual activity: Not on file  Other Topics Concern   Not on file  Social History Narrative   Not on file   Social Determinants of Health   Financial Resource Strain: Not on file  Food Insecurity: Not on file  Transportation Needs: Not on file  Physical Activity: Not on file  Stress: Not on file  Social Connections: Not on file  Intimate Partner Violence: Not on file    Outpatient Medications Prior to Visit  Medication Sig Dispense Refill   lisinopril (ZESTRIL) 10 MG tablet Take 1 tablet (10 mg total) by mouth daily. 90 tablet 1   No facility-administered medications prior to visit.    Allergies  Allergen Reactions   Orange Peel [Orange Oil] Dermatitis    Review of Systems All review of systems negative except what is listed in the HPI     Objective:    Physical Exam Vitals reviewed.  Constitutional:      Appearance: Normal appearance. He is normal weight.  HENT:     Head: Normocephalic and atraumatic.  Cardiovascular:     Rate and Rhythm: Normal rate and regular  rhythm.     Heart sounds: Normal heart sounds.  Pulmonary:     Effort: Pulmonary effort is normal.     Breath sounds: Normal breath sounds.  Musculoskeletal:     Cervical back: Normal range of motion and neck supple.  Skin:    General: Skin is warm and dry.  Neurological:     General: No focal deficit present.     Mental Status: He is alert and oriented to person, place, and time. Mental status is at baseline.  Psychiatric:        Mood and Affect: Mood normal.        Behavior: Behavior normal.        Thought Content: Thought content normal.        Judgment: Judgment normal.    BP 138/83 (BP Location: Left Arm, Patient Position: Sitting, Cuff Size: Large)    Pulse 71    Temp 98.8 F (37.1 C)  (Oral)    Ht $R'5\' 10"'cO$  (1.778 m)    Wt 198 lb 1.9 oz (89.9 kg)    SpO2 97%    BMI 28.43 kg/m  Wt Readings from Last 3 Encounters:  03/31/21 198 lb 1.9 oz (89.9 kg)  12/26/20 200 lb 0.6 oz (90.7 kg)  01/10/20 194 lb (88 kg)    Health Maintenance Due  Topic Date Due   HIV Screening  Never done   Hepatitis C Screening  Never done    There are no preventive care reminders to display for this patient.   No results found for: TSH Lab Results  Component Value Date   WBC 5.3 12/26/2020   HGB 16.2 12/26/2020   HCT 47.8 12/26/2020   MCV 87.9 12/26/2020   PLT 267 12/26/2020   Lab Results  Component Value Date   NA 138 12/26/2020   K 4.3 12/26/2020   CO2 30 12/26/2020   GLUCOSE 103 (H) 12/26/2020   BUN 19 12/26/2020   CREATININE 0.95 12/26/2020   BILITOT 0.5 12/26/2020   AST 29 12/26/2020   ALT 29 12/26/2020   PROT 7.2 12/26/2020   CALCIUM 9.9 12/26/2020   EGFR 93 12/26/2020   Lab Results  Component Value Date   CHOL 232 (H) 12/26/2020   Lab Results  Component Value Date   HDL 54 12/26/2020   Lab Results  Component Value Date   LDLCALC 140 (H) 12/26/2020   Lab Results  Component Value Date   TRIG 237 (H) 12/26/2020   Lab Results  Component Value Date   CHOLHDL 4.3 12/26/2020   No results found for: HGBA1C     Assessment & Plan:     1. Essential hypertension 2. Chest pain, unspecified type Increasing your lisinopril to 20 mg daily. It is important that you are taking this everyday as prescribed.  Adding a cardiologist referral to further workup your chest pain - they will call you to schedule. If chest pain becomes more severe or if more symptoms develop, go to the emergency department.   - lisinopril (ZESTRIL) 20 MG tablet; Take 1 tablet (20 mg total) by mouth daily.  Dispense: 90 tablet; Refill: 0 - Ambulatory referral to Cardiology - BASIC METABOLIC PANEL WITH GFR; Future   Follow-up in 2 weeks for nurse visit BP check and repeat BMP.   Terrilyn Saver, NP

## 2021-04-01 ENCOUNTER — Telehealth: Payer: Self-pay | Admitting: General Practice

## 2021-04-01 NOTE — Telephone Encounter (Signed)
Transition Care Management Follow-up Telephone Call Date of discharge and from where: 03/30/21 How have you been since you were released from the hospital? Patient had OV 03/31/21 Any questions or concerns? No

## 2021-04-08 NOTE — Progress Notes (Signed)
Referring-Alan Metheney MD Reason for referral-chest pain  HPI: 61 year old male for evaluation of chest pain at request of Alan Lecher MD.  Patient seen recently at [facility] with chest pain.  Laboratory showed hemoglobin 15.5, normal liver functions and normal troponins.  Chest x-ray January 2023 showed no acute pulmonary disease.  Patient stated that he was in bed when he developed his chest pressure back in December.  It did not radiate.  There was some shortness of breath but no nausea or diaphoresis.  The pain lasted approximately 4 to 5 hours and resolved.  It was not pleuritic or positional.  He otherwise denies exertional chest pain, dyspnea on exertion, orthopnea, PND, pedal edema or syncope.  He did state that his blood pressure was elevated when he went to the emergency room and he was placed on lisinopril.  On 20 mg of lisinopril he has been in the 115-120 range for SBP.  Cardiology now asked to evaluate.  Current Outpatient Medications  Medication Sig Dispense Refill   lisinopril (ZESTRIL) 20 MG tablet Take 1 tablet (20 mg total) by mouth daily. 90 tablet 0   No current facility-administered medications for this visit.    Allergies  Allergen Reactions   Orange Peel [Orange Oil] Dermatitis     Past Medical History:  Diagnosis Date   Hypertension     History reviewed. No pertinent surgical history.  Social History   Socioeconomic History   Marital status: Married    Spouse name: Not on file   Number of children: 1   Years of education: Not on file   Highest education level: Not on file  Occupational History   Not on file  Tobacco Use   Smoking status: Never   Smokeless tobacco: Never  Vaping Use   Vaping Use: Never used  Substance and Sexual Activity   Alcohol use: Yes    Comment: once a week   Drug use: Never   Sexual activity: Not on file  Other Topics Concern   Not on file  Social History Narrative   Not on file   Social Determinants of  Health   Financial Resource Strain: Not on file  Food Insecurity: Not on file  Transportation Needs: Not on file  Physical Activity: Not on file  Stress: Not on file  Social Connections: Not on file  Intimate Partner Violence: Not on file    Family History  Problem Relation Age of Onset   Diabetes Mother    Diabetes Father     ROS: no fevers or chills, productive cough, hemoptysis, dysphasia, odynophagia, melena, hematochezia, dysuria, hematuria, rash, seizure activity, orthopnea, PND, pedal edema, claudication. Remaining systems are negative.  Physical Exam:   Blood pressure (!) 148/106, pulse 66, height 5\' 10"  (1.778 m), weight 199 lb 12.8 oz (90.6 kg), SpO2 94 %.  General:  Well developed/well nourished in NAD Skin warm/dry Patient not depressed No peripheral clubbing Back-normal HEENT-normal/normal eyelids Neck supple/normal carotid upstroke bilaterally; no bruits; no JVD; no thyromegaly chest - CTA/ normal expansion CV - RRR/normal S1 and S2; no murmurs, rubs or gallops;  PMI nondisplaced Abdomen -NT/ND, no HSM, no mass, + bowel sounds, no bruit 2+ femoral pulses, no bruits Ext-no edema, chords, 2+ DP Neuro-grossly nonfocal  ECG -normal sinus rhythm at a rate of 66, nonspecific ST changes personally reviewed  A/P  1 chest pain-symptoms are atypical.  We will arrange cardiac CTA to further evaluate and rule out obstructive coronary disease.  2 hypertension-patient's blood  pressure is elevated today.  However he has been following this at home since lisinopril was initiated and it is controlled.  He will continue to follow and we can advance as needed.  Alan Ruths, MD

## 2021-04-20 ENCOUNTER — Encounter: Payer: Self-pay | Admitting: Cardiology

## 2021-04-20 ENCOUNTER — Ambulatory Visit (INDEPENDENT_AMBULATORY_CARE_PROVIDER_SITE_OTHER): Payer: 59 | Admitting: Family Medicine

## 2021-04-20 ENCOUNTER — Ambulatory Visit: Payer: 59 | Admitting: Cardiology

## 2021-04-20 ENCOUNTER — Other Ambulatory Visit: Payer: Self-pay

## 2021-04-20 VITALS — BP 148/106 | HR 66 | Ht 70.0 in | Wt 199.8 lb

## 2021-04-20 VITALS — BP 148/87 | HR 67 | Temp 97.8°F | Ht 70.0 in | Wt 200.0 lb

## 2021-04-20 DIAGNOSIS — I10 Essential (primary) hypertension: Secondary | ICD-10-CM

## 2021-04-20 DIAGNOSIS — R072 Precordial pain: Secondary | ICD-10-CM | POA: Diagnosis not present

## 2021-04-20 LAB — BASIC METABOLIC PANEL WITH GFR
BUN: 23 mg/dL (ref 7–25)
CO2: 31 mmol/L (ref 20–32)
Calcium: 9.6 mg/dL (ref 8.6–10.3)
Chloride: 104 mmol/L (ref 98–110)
Creat: 0.91 mg/dL (ref 0.70–1.30)
Glucose, Bld: 103 mg/dL — ABNORMAL HIGH (ref 65–99)
Potassium: 4.7 mmol/L (ref 3.5–5.3)
Sodium: 138 mmol/L (ref 135–146)
eGFR: 98 mL/min/{1.73_m2} (ref >=60)

## 2021-04-20 MED ORDER — METOPROLOL TARTRATE 100 MG PO TABS: ORAL_TABLET | ORAL | 0 refills | Status: DC

## 2021-04-20 NOTE — Progress Notes (Signed)
Not well controlled.  Agree with above to increase lisinopril to 40 mg daily.  Can I send over new prescription if needed.  Follow up in  2 weeks.

## 2021-04-20 NOTE — Patient Instructions (Signed)
°  Testing/Procedures:  Your cardiac CT will be scheduled at   [facility] [address] [place], [place] [address] [phone]   If scheduled at [facility], please arrive at the [facility] main entrance (entrance A) of [facility] 30 minutes prior to test start time. You can use the FREE valet parking offered at the main entrance (encouraged to control the heart rate for the test) Proceed to the [facility] Radiology Department (first floor) to check-in and test prep.   Please follow these instructions carefully (unless otherwise directed):  Hold all erectile dysfunction medications at least 3 days (72 hrs) prior to test.  On the Night Before the Test: Be sure to Drink plenty of water. Do not consume any caffeinated/decaffeinated beverages or chocolate 12 hours prior to your test. Do not take any antihistamines 12 hours prior to your test.   On the Day of the Test: Drink plenty of water until 1 hour prior to the test. Do not eat any food 4 hours prior to the test. You may take your regular medications prior to the test.  Take metoprolol (Lopressor) 100 mg two hours prior to test. HOLD Furosemide/Hydrochlorothiazide morning of the test.      After the Test: Drink plenty of water. After receiving IV contrast, you may experience a mild flushed feeling. This is normal. On occasion, you may experience a mild rash up to 24 hours after the test. This is not dangerous. If this occurs, you can take Benadryl 25 mg and increase your fluid intake. If you experience trouble breathing, this can be serious. If it is severe call 911 IMMEDIATELY. If it is mild, please call our office. If you take any of these medications: Glipizide/Metformin, Avandament, Glucavance, please do not take 48 hours after completing test unless otherwise instructed.  We will call to schedule your test 2-4 weeks out understanding that some insurance companies will need an authorization  prior to the service being performed.   For non-scheduling related questions, please contact the cardiac imaging nurse navigator should you have any questions/concerns: Rockwell Alexandria, Cardiac Imaging Nurse Navigator Larey Brick, Cardiac Imaging Nurse Navigator [facility] Direct Office Dial: [phone]   For scheduling needs, including cancellations and rescheduling, please call [place], [phone].    Follow-Up: At [facility], you and your health needs are our priority.  As part of our continuing mission to provide you with exceptional heart care, we have created designated Provider Care Teams.  These Care Teams include your primary Cardiologist (physician) and Advanced Practice Providers (APPs -  Physician Assistants and Nurse Practitioners) who all work together to provide you with the care you need, when you need it.  We recommend signing up for the patient portal called "MyChart".  Sign up information is provided on this After Visit Summary.  MyChart is used to connect with patients for Virtual Visits (Telemedicine).  Patients are able to view lab/test results, encounter notes, upcoming appointments, etc.  Non-urgent messages can be sent to your provider as well.   To learn more about what you can do with MyChart, go to [email or web address].    Your next appointment:    As needed

## 2021-04-20 NOTE — Progress Notes (Signed)
Ordered labs per last office note. 

## 2021-04-20 NOTE — Progress Notes (Signed)
Your lab work is within acceptable range and there are no concerning findings.   ?

## 2021-04-20 NOTE — Progress Notes (Signed)
Patient is here for a blood pressure check. Denies chest pains, SOB, palpitations, lightheadedness, dizziness, mood or medication problems.   Patient first blood pressure reading was not at goal. Patient sat for 10 minutes. Second blood pressure reading was not at goal. Per provider's request, patient is to increased Lisinopril to 40 mg (2 tabs daily). Patient was agreeable with plan. Patient advised to schedule next appointment in 2 weeks for a blood pressure check.

## 2021-04-29 ENCOUNTER — Telehealth (HOSPITAL_COMMUNITY): Payer: Self-pay | Admitting: *Deleted

## 2021-04-29 NOTE — Telephone Encounter (Signed)
Attempted to call patient regarding upcoming cardiac CT appointment. °Left message on voicemail with name and callback number ° °Leeah Politano RN Navigator Cardiac Imaging °[facility] °[phone] Office °[phone] Cell ° °

## 2021-04-30 ENCOUNTER — Ambulatory Visit (HOSPITAL_COMMUNITY)
Admission: RE | Admit: 2021-04-30 | Discharge: 2021-04-30 | Disposition: A | Discharge status: Home / Self Care | Payer: 59 | Source: Ambulatory Visit | Attending: Cardiology | Admitting: Cardiology

## 2021-04-30 ENCOUNTER — Other Ambulatory Visit: Payer: Self-pay

## 2021-04-30 ENCOUNTER — Encounter (HOSPITAL_COMMUNITY): Payer: Self-pay

## 2021-04-30 DIAGNOSIS — R072 Precordial pain: Secondary | ICD-10-CM | POA: Insufficient documentation

## 2021-04-30 MED ORDER — NITROGLYCERIN 0.4 MG SL SUBL: 0.8000 mg | SUBLINGUAL_TABLET | Freq: Once | SUBLINGUAL | Status: AC

## 2021-04-30 MED ORDER — IOHEXOL 350 MG/ML SOLN
95.0000 mL | Freq: Once | INTRAVENOUS | Status: AC | PRN
  Administered 2021-04-30: 95 mL via INTRAVENOUS

## 2021-04-30 MED ORDER — NITROGLYCERIN 0.4 MG SL SUBL
SUBLINGUAL_TABLET | SUBLINGUAL | Status: AC
  Administered 2021-04-30: 0.8 mg via SUBLINGUAL
  Filled 2021-04-30: qty 2

## 2021-05-01 ENCOUNTER — Encounter: Payer: Self-pay | Admitting: Cardiology

## 2021-05-01 NOTE — Telephone Encounter (Signed)
This encounter was created in error - please disregard.

## 2021-05-01 NOTE — Telephone Encounter (Signed)
Patient returning call for CT results. 

## 2021-07-01 ENCOUNTER — Other Ambulatory Visit: Payer: Self-pay | Admitting: Family Medicine

## 2021-07-01 DIAGNOSIS — I10 Essential (primary) hypertension: Secondary | ICD-10-CM

## 2021-07-01 DIAGNOSIS — R079 Chest pain, unspecified: Secondary | ICD-10-CM

## 2021-09-30 ENCOUNTER — Other Ambulatory Visit: Payer: Self-pay | Admitting: Family Medicine

## 2021-09-30 DIAGNOSIS — R079 Chest pain, unspecified: Secondary | ICD-10-CM

## 2021-09-30 DIAGNOSIS — I10 Essential (primary) hypertension: Secondary | ICD-10-CM

## 2021-10-27 ENCOUNTER — Other Ambulatory Visit: Payer: Self-pay | Admitting: Family Medicine

## 2021-10-27 DIAGNOSIS — I10 Essential (primary) hypertension: Secondary | ICD-10-CM

## 2021-10-27 DIAGNOSIS — R079 Chest pain, unspecified: Secondary | ICD-10-CM

## 2021-10-27 NOTE — Telephone Encounter (Signed)
Would you like this to be a f/u with Dr. Linford Arnold or just a nurse visit?

## 2021-10-27 NOTE — Telephone Encounter (Signed)
Please call pt and schedule an appt. He was supposed to have f/u with the nurse in February for a BP check and have labs done, he did not.   Refill sent for 30 day supply for now.

## 2021-10-27 NOTE — Telephone Encounter (Signed)
V.Mail left for patient to return call and schedule a f/u appt w/ PCP.

## 2021-10-27 NOTE — Telephone Encounter (Signed)
Preferably Dr. Linford Arnold please and thank you!

## 2021-12-01 ENCOUNTER — Other Ambulatory Visit: Payer: Self-pay | Admitting: Family Medicine

## 2021-12-01 DIAGNOSIS — R079 Chest pain, unspecified: Secondary | ICD-10-CM

## 2021-12-01 DIAGNOSIS — I10 Essential (primary) hypertension: Secondary | ICD-10-CM

## 2021-12-08 ENCOUNTER — Encounter: Payer: Self-pay | Admitting: Family Medicine

## 2021-12-08 ENCOUNTER — Ambulatory Visit (INDEPENDENT_AMBULATORY_CARE_PROVIDER_SITE_OTHER): Payer: 59 | Admitting: Family Medicine

## 2021-12-08 VITALS — BP 174/81 | HR 64 | Wt 199.1 lb

## 2021-12-08 DIAGNOSIS — Z0000 Encounter for general adult medical examination without abnormal findings: Secondary | ICD-10-CM

## 2021-12-08 DIAGNOSIS — I10 Essential (primary) hypertension: Secondary | ICD-10-CM

## 2021-12-08 DIAGNOSIS — R079 Chest pain, unspecified: Secondary | ICD-10-CM | POA: Diagnosis not present

## 2021-12-08 MED ORDER — LISINOPRIL 40 MG PO TABS: 40.0000 mg | ORAL_TABLET | Freq: Every day | ORAL | 3 refills | Status: DC

## 2021-12-08 NOTE — Progress Notes (Signed)
Complete physical exam  Patient: Alan Rodriguez   DOB: 10/13/1960   61 y.o. Male  MRN: [id number]  Subjective:    Chief Complaint  Patient presents with   Annual Exam    Alan Rodriguez is a 61 y.o. male who presents today for a complete physical exam. He reports consuming a general diet.  Physically active  He generally feels well. He reports sleeping fairly well. He does not have additional problems to discuss today.    Most recent fall risk assessment:    12/08/2021    8:28 AM  Fall Risk   Falls in the past year? 0  Number falls in past yr: 0  Injury with Fall? 0  Follow up Falls evaluation completed     Most recent depression screenings:    12/08/2021    8:28 AM 12/26/2020    3:51 PM  PHQ 2/9 Scores  PHQ - 2 Score 0 0        Patient Care Team: Agapito Games, MD as PCP - General (Family Medicine)   Outpatient Medications Prior to Visit  Medication Sig   [DISCONTINUED] lisinopril (ZESTRIL) 20 MG tablet Take 1 tablet (20 mg total) by mouth daily. APPT FOR REFILLS   [DISCONTINUED] metoprolol tartrate (LOPRESSOR) 100 MG tablet Take 2 hours prior to CT scan (Patient not taking: Reported on 12/08/2021)   No facility-administered medications prior to visit.    ROS      Objective:     BP (!) 174/81   Pulse 64   Wt 199 lb 1.9 oz (90.3 kg)   SpO2 97%   BMI 28.57 kg/m    Physical Exam Constitutional:      Appearance: He is well-developed.  HENT:     Head: Normocephalic and atraumatic.     Right Ear: Tympanic membrane, ear canal and external ear normal.     Left Ear: Tympanic membrane, ear canal and external ear normal.     Nose: Nose normal.     Mouth/Throat:     Pharynx: Oropharynx is clear.  Eyes:     Conjunctiva/sclera: Conjunctivae normal.     Pupils: Pupils are equal, round, and reactive to light.  Neck:     Thyroid: No thyromegaly.  Cardiovascular:     Rate and Rhythm: Normal rate and regular rhythm.     Heart sounds: Normal heart  sounds.  Pulmonary:     Effort: Pulmonary effort is normal.     Breath sounds: Normal breath sounds.  Abdominal:     General: Bowel sounds are normal. There is no distension.     Palpations: Abdomen is soft. There is no mass.     Tenderness: There is no abdominal tenderness. There is no guarding or rebound.  Musculoskeletal:        General: Normal range of motion.     Cervical back: Normal range of motion and neck supple. No tenderness.  Lymphadenopathy:     Cervical: No cervical adenopathy.  Skin:    General: Skin is warm and dry.  Neurological:     Mental Status: He is alert and oriented to person, place, and time.     Deep Tendon Reflexes: Reflexes are normal and symmetric.  Psychiatric:        Behavior: Behavior normal.        Thought Content: Thought content normal.        Judgment: Judgment normal.      No results found for any visits on 12/08/21.  Assessment & Plan:    Routine Health Maintenance and Physical Exam  Immunization History  Administered Date(s) Administered   Rabies, IM 12/30/2019, 01/02/2020, 01/05/2020, 01/12/2020, 01/26/2020   Tdap 12/30/2019    Health Maintenance  Topic Date Due   COVID-19 Vaccine (1) Never done   HIV Screening  Never done   Hepatitis C Screening  Never done   Zoster Vaccines- Shingrix (1 of 2) Never done   INFLUENZA VACCINE  Never done   COLONOSCOPY (Pts 45-70yrs Insurance coverage will need to be confirmed)  03/29/2024   TETANUS/TDAP  12/29/2029   Pneumococcal Vaccine 51-36 Years old  Aged Out   HPV VACCINES  Aged Out    Discussed health benefits of physical activity, and encouraged him to engage in regular exercise appropriate for his age and condition.  Problem List Items Addressed This Visit       Cardiovascular and Mediastinum   Essential hypertension    Uncontrolled.  Will inc lisinopril to 40mg .  Goal BP is under 130.        Relevant Medications   lisinopril (ZESTRIL) 40 MG tablet   Other Visit  Diagnoses     Wellness examination    -  Primary   Relevant Orders   PSA   Lipid panel   COMPLETE METABOLIC PANEL WITH GFR   CBC   Chest pain, unspecified type           Keep up a regular exercise program and make sure you are eating a healthy diet Try to eat 4 servings of dairy a day, or if you are lactose intolerant take a calcium with vitamin D daily.  Your vaccines are up to date.   Return in about 1 year (around 12/09/2022) for Wellness Exam.     02/08/2023, MD

## 2021-12-08 NOTE — Assessment & Plan Note (Signed)
Uncontrolled.  Will inc lisinopril to 40mg .  Goal BP is under 130.

## 2021-12-09 LAB — COMPLETE METABOLIC PANEL WITH GFR
AG Ratio: 2 (calc) (ref 1.0–2.5)
ALT: 29 U/L (ref 9–46)
AST: 28 U/L (ref 10–35)
Albumin: 4.7 g/dL (ref 3.6–5.1)
Alkaline phosphatase (APISO): 59 U/L (ref 35–144)
BUN: 21 mg/dL (ref 7–25)
CO2: 28 mmol/L (ref 20–32)
Calcium: 9.6 mg/dL (ref 8.6–10.3)
Chloride: 104 mmol/L (ref 98–110)
Creat: 0.92 mg/dL (ref 0.70–1.30)
Globulin: 2.4 g/dL (calc) (ref 1.9–3.7)
Glucose, Bld: 103 mg/dL — ABNORMAL HIGH (ref 65–99)
Potassium: 4.8 mmol/L (ref 3.5–5.3)
Sodium: 140 mmol/L (ref 135–146)
Total Bilirubin: 0.8 mg/dL (ref 0.2–1.2)
Total Protein: 7.1 g/dL (ref 6.1–8.1)
eGFR: 96 mL/min/{1.73_m2} (ref >=60)

## 2021-12-09 LAB — CBC
HCT: 46.9 % (ref 38.5–50.0)
Hemoglobin: 16 g/dL (ref 13.2–17.1)
MCH: 29.1 pg (ref 27.0–33.0)
MCHC: 34.1 g/dL (ref 32.0–36.0)
MCV: 85.3 fL (ref 80.0–100.0)
MPV: 11.4 fL (ref 7.5–12.5)
Platelets: 213 10*3/uL (ref 140–400)
RBC: 5.5 10*6/uL (ref 4.20–5.80)
RDW: 12.2 % (ref 11.0–15.0)
WBC: 3.6 10*3/uL — ABNORMAL LOW (ref 3.8–10.8)

## 2021-12-09 LAB — LIPID PANEL
Cholesterol: 222 mg/dL — ABNORMAL HIGH (ref <200)
HDL: 56 mg/dL (ref >=40)
LDL Cholesterol (Calc): 139 mg/dL (calc) — ABNORMAL HIGH
Non-HDL Cholesterol (Calc): 166 mg/dL (calc) — ABNORMAL HIGH (ref <130)
Total CHOL/HDL Ratio: 4 (calc) (ref <5.0)
Triglycerides: 144 mg/dL (ref <150)

## 2021-12-09 LAB — PSA: PSA: 0.81 ng/mL (ref <=4.00)

## 2021-12-09 NOTE — Progress Notes (Signed)
Call patient: LDL cholesterol still elevated.  Your risk for heart attack or stroke in the next 10 years is very high at almost 15%.  Based on that calculation and current guidelines it is highly recommended that you consider taking a statin which is a cholesterol-lowering drug.  This has been shown to actually reduce your risk for heart attack and stroke.  If you are okay with proceeding forward with the medication then please let me know.  All other labs look normal.  The 10-year ASCVD risk score (Arnett DK, et al., 2019) is: 14.5%   Values used to calculate the score:     Age: 61 years     Sex: Male     Is Non-Hispanic African American: No     Diabetic: No     Tobacco smoker: No     Systolic Blood Pressure: 174 mmHg     Is BP treated: Yes     HDL Cholesterol: 56 mg/dL     Total Cholesterol: 222 mg/dL

## 2021-12-14 ENCOUNTER — Other Ambulatory Visit: Payer: Self-pay | Admitting: Family Medicine

## 2021-12-14 DIAGNOSIS — R079 Chest pain, unspecified: Secondary | ICD-10-CM

## 2021-12-14 DIAGNOSIS — I10 Essential (primary) hypertension: Secondary | ICD-10-CM

## 2022-01-07 ENCOUNTER — Telehealth: Payer: Self-pay | Admitting: Family Medicine

## 2022-01-07 MED ORDER — ATORVASTATIN CALCIUM 20 MG PO TABS: 20.0000 mg | ORAL_TABLET | Freq: Every day | ORAL | 3 refills | Status: DC

## 2022-01-07 NOTE — Telephone Encounter (Signed)
Meds ordered this encounter  Medications   atorvastatin (LIPITOR) 20 MG tablet    Sig: Take 1 tablet (20 mg total) by mouth at bedtime.    Dispense:  90 tablet    Refill:  3

## 2022-04-22 ENCOUNTER — Ambulatory Visit (INDEPENDENT_AMBULATORY_CARE_PROVIDER_SITE_OTHER): Payer: 59 | Admitting: Family Medicine

## 2022-04-22 ENCOUNTER — Encounter: Payer: Self-pay | Admitting: Family Medicine

## 2022-04-22 VITALS — BP 139/83 | HR 78 | Ht 70.0 in | Wt 203.0 lb

## 2022-04-22 DIAGNOSIS — I10 Essential (primary) hypertension: Secondary | ICD-10-CM | POA: Diagnosis not present

## 2022-04-22 DIAGNOSIS — E785 Hyperlipidemia, unspecified: Secondary | ICD-10-CM

## 2022-04-22 DIAGNOSIS — R5383 Other fatigue: Secondary | ICD-10-CM | POA: Diagnosis not present

## 2022-04-22 DIAGNOSIS — E782 Mixed hyperlipidemia: Secondary | ICD-10-CM | POA: Diagnosis not present

## 2022-04-22 DIAGNOSIS — L821 Other seborrheic keratosis: Secondary | ICD-10-CM | POA: Diagnosis not present

## 2022-04-22 MED ORDER — ATORVASTATIN CALCIUM 20 MG PO TABS: 20.0000 mg | ORAL_TABLET | Freq: Every day | ORAL | 3 refills | Status: DC

## 2022-04-22 MED ORDER — LISINOPRIL 40 MG PO TABS: 40.0000 mg | ORAL_TABLET | Freq: Every day | ORAL | 3 refills | Status: DC

## 2022-04-22 NOTE — Assessment & Plan Note (Signed)
He did bring in home blood pressure log.  Half of them are at goal the other half are elevated.  His cuff here in the office is reading about 20 points lower than our machine.  On recheck blood pressure did improve slightly.  We did discuss that he still needs about a 5-10 point blood pressure reduction.  He is currently only taking lisinopril 20 mg he still had an old bottle of the lower dose.  Encouraged him to go ahead and start the 40 mg.  Will get updated labs today.

## 2022-04-22 NOTE — Assessment & Plan Note (Signed)
Check lipids and liver enzymes on atorvastatin.  He says tolerating it well without any side effects or problems.

## 2022-04-22 NOTE — Patient Instructions (Addendum)
Please start the lisinopril 40mg  daily, it should lower your BP by about 5 more points.

## 2022-04-22 NOTE — Progress Notes (Signed)
Established Patient Office Visit  Subjective   Patient ID: Alan Rodriguez, male    DOB: [birth year]  Age: 62 y.o. MRN: [id number]  Chief Complaint  Patient presents with   Hypertension    He brought in his home BP cuff 133/87 p: 70 He stated that his BP is usually higher in the mornings   Nevus    Pt has a mole on L cheek that has gotten a little bigger. He would like for this to be checked    HPI  Hypertension- Pt denies chest pain, SOB, dizziness, or heart palpitations.  Taking meds as directed w/o problems.  Denies medication side effects.  Brough in home BP log   Hyperlipidemia - tolerating stating well with no myalgias or significant side effects.  Lab Results  Component Value Date   CHOL 222 (H) 12/08/2021   HDL 56 12/08/2021   LDLCALC 139 (H) 12/08/2021   TRIG 144 12/08/2021   CHOLHDL 4.0 12/08/2021    He would also like be tested for low testosterone.    Androgen Deficiency in the Aging Male     [place] Name 04/22/22 0800         Androgen Deficiency in the Aging Male   Do you have a decrease in libido (sex drive) No     Do you have lack of energy No     Do you have a decrease in strength and/or endurance No     Have you lost height No     Have you noticed a decreased "enjoyment of life" No     Are you sad and/or grumpy No     Are your erections less strong Yes     Have you noticed a recent deterioration in your ability to play sports No     Are you falling asleep after dinner Yes     Has there been a recent deterioration in your work performance No                  ROS    Objective:     BP 139/83   Pulse 78   Ht 5\' 10"  (1.778 m)   Wt 203 lb (92.1 kg)   SpO2 96%   BMI 29.13 kg/m    Physical Exam Constitutional:      Appearance: He is well-developed.  HENT:     Head: Normocephalic and atraumatic.  Cardiovascular:     Rate and Rhythm: Normal rate and regular rhythm.     Heart sounds: Normal heart sounds.  Pulmonary:     Effort:  Pulmonary effort is normal.     Breath sounds: Normal breath sounds.  Skin:    General: Skin is warm and dry.     Comments: His left facial cheek he has a medium brown hyperkeratotic lesion.  Neurological:     Mental Status: He is alert and oriented to person, place, and time.  Psychiatric:        Behavior: Behavior normal.      No results found for any visits on 04/22/22.    The 10-year ASCVD risk score (Arnett DK, et al., 2019) is: 10.8%    Assessment & Plan:   Problem List Items Addressed This Visit       Cardiovascular and Mediastinum   Essential hypertension - Primary    He did bring in home blood pressure log.  Half of them are at goal the other half are elevated.  His cuff here in the office  is reading about 20 points lower than our machine.  On recheck blood pressure did improve slightly.  We did discuss that he still needs about a 5-10 point blood pressure reduction.  He is currently only taking lisinopril 20 mg he still had an old bottle of the lower dose.  Encouraged him to go ahead and start the 40 mg.  Will get updated labs today.      Relevant Medications   lisinopril (ZESTRIL) 40 MG tablet   atorvastatin (LIPITOR) 20 MG tablet   Other Relevant Orders   Lipid Panel w/reflex Direct LDL   COMPLETE METABOLIC PANEL WITH GFR     Other   Hyperlipidemia    Check lipids and liver enzymes on atorvastatin.  He says tolerating it well without any side effects or problems.      Relevant Medications   lisinopril (ZESTRIL) 40 MG tablet   atorvastatin (LIPITOR) 20 MG tablet   Other Visit Diagnoses     Elevated cholesterol with high triglycerides       Relevant Medications   lisinopril (ZESTRIL) 40 MG tablet   atorvastatin (LIPITOR) 20 MG tablet   Other Relevant Orders   Lipid Panel w/reflex Direct LDL   COMPLETE METABOLIC PANEL WITH GFR   Seborrheic keratosis       Other fatigue       Relevant Orders   Lipid Panel w/reflex Direct LDL   COMPLETE METABOLIC  PANEL WITH GFR   Testosterone Total,Free,Bio, Males      Will also do additional labs to evaluate for hypogonadism.  If testosterone levels are low then we will repeat in 2 weeks to confirm.  Seborrheic keratoses-discussed benign nature of lesion.  He would like treatment.  Cryotherapy Procedure Note  Pre-operative Diagnosis: seb keratosis on left cheek   Post-operative Diagnosis: same  Locations: left facial cheek.   Indications: irritation    Procedure Details  Patient informed of risks (permanent scarring, infection, light or dark discoloration, bleeding, infection, weakness, numbness and recurrence of the lesion) and benefits of the procedure and verbal informed consent obtained.  The areas are treated with liquid nitrogen therapy, frozen until ice ball extended 1-2 mm beyond lesion, allowed to thaw, and treated again. The patient tolerated procedure well.  The patient was instructed on post-op care, warned that there may be blister formation, redness and pain. Recommend OTC analgesia as needed for pain.  Condition: Stable  Complications: none.  Plan: 1. Instructed to keep the area dry and covered for 24-48h and clean thereafter. 2. Warning signs of infection were reviewed.   3. Recommended that the patient use OTC acetaminophen as needed for pain.    Return in about 1 month (around 05/23/2022) for nurse visit for bp check bring in bp cuff.    Beatrice Lecher, MD

## 2022-04-23 LAB — COMPLETE METABOLIC PANEL WITH GFR
AG Ratio: 2 (calc) (ref 1.0–2.5)
ALT: 22 U/L (ref 9–46)
AST: 27 U/L (ref 10–35)
Albumin: 4.8 g/dL (ref 3.6–5.1)
Alkaline phosphatase (APISO): 54 U/L (ref 35–144)
BUN: 21 mg/dL (ref 7–25)
CO2: 29 mmol/L (ref 20–32)
Calcium: 9.6 mg/dL (ref 8.6–10.3)
Chloride: 105 mmol/L (ref 98–110)
Creat: 0.88 mg/dL (ref 0.70–1.30)
Globulin: 2.4 g/dL (calc) (ref 1.9–3.7)
Glucose, Bld: 100 mg/dL — ABNORMAL HIGH (ref 65–99)
Potassium: 5 mmol/L (ref 3.5–5.3)
Sodium: 140 mmol/L (ref 135–146)
Total Bilirubin: 0.8 mg/dL (ref 0.2–1.2)
Total Protein: 7.2 g/dL (ref 6.1–8.1)
eGFR: 99 mL/min/{1.73_m2} (ref >=60)

## 2022-04-23 LAB — LIPID PANEL W/REFLEX DIRECT LDL
Cholesterol: 171 mg/dL (ref <200)
HDL: 58 mg/dL (ref >=40)
LDL Cholesterol (Calc): 92 mg/dL (calc)
Non-HDL Cholesterol (Calc): 113 mg/dL (calc) (ref <130)
Total CHOL/HDL Ratio: 2.9 (calc) (ref <5.0)
Triglycerides: 109 mg/dL (ref <150)

## 2022-04-23 LAB — TESTOSTERONE TOTAL,FREE,BIO, MALES
Albumin: 4.8 g/dL (ref 3.6–5.1)
Sex Hormone Binding: 49 nmol/L (ref 22–77)
Testosterone, Bioavailable: 111.2 ng/dL (ref 110.0–575.0)
Testosterone, Free: 50.9 pg/mL (ref 46.0–224.0)
Testosterone: 535 ng/dL (ref 250–827)

## 2022-04-23 NOTE — Progress Notes (Signed)
HI Sheriff,  Metabolic panel looks great.  Cholesterol looks much better this year.  In fact it is down almost 50 points which is absolutely fantastic!  Please continue with the cholesterol medication and it is making a big difference.  Testosterone level looks great.  No sign of deficiency.

## 2022-05-24 ENCOUNTER — Ambulatory Visit (INDEPENDENT_AMBULATORY_CARE_PROVIDER_SITE_OTHER): Payer: 59 | Admitting: Family Medicine

## 2022-05-24 VITALS — BP 138/78 | HR 69 | Ht 70.0 in

## 2022-05-24 DIAGNOSIS — I10 Essential (primary) hypertension: Secondary | ICD-10-CM | POA: Diagnosis not present

## 2022-05-24 MED ORDER — AMLODIPINE BESYLATE 2.5 MG PO TABS: 2.5000 mg | ORAL_TABLET | Freq: Every day | ORAL | 1 refills | Status: DC

## 2022-05-24 NOTE — Progress Notes (Signed)
   Established Patient Office Visit  Subjective   Patient ID: Alan Rodriguez, male    DOB: [birth year]  Age: 62 y.o. MRN: [id number]  Chief Complaint  Patient presents with   Hypertension    BP check nurse visit-     HPI  Hypertension- BP check nurse visit- patient verifies that is currently taking Lisinopril 81m - patient brought in home BP cuff for comparison ( wrist ) - patient denies chest pain, shortness of breath, dizziness,  or problems with medication.    ROS    Objective:     BP (!) 145/86   Pulse 69   Ht 5' 10"$  (1.778 m)   SpO2 98%   BMI 29.13 kg/m    Physical Exam   No results found for any visits on 05/24/22.    The 10-year ASCVD risk score (Arnett DK, et al., 2019) is: 8.8%    Assessment & Plan:  Hypertention- BP check nurse visit - initial reading =145/86 (home wrist cuff = 122/81)- second reading 138/78 ( home wrist cuff  122/80) - per DR. Metheney will add Amlodipine 2.5 mg once daily . Patient should schedule for a one month follow up  ( nurse visit ) . Patient informed that  wrist cuff not as accurate as bicep cuff. He will purchase a bicep cuff before next nurse visit and will bring in with him to the appointment.  Problem List Items Addressed This Visit   None   No follow-ups on file.    KRae Lips LPN

## 2022-05-24 NOTE — Progress Notes (Signed)
Pressure improved but not quite at goal.  Will send a new prescription for amlodipine 2.5 mg daily.  Meds ordered this encounter  Medications   amLODipine (NORVASC) 2.5 MG tablet    Sig: Take 1 tablet (2.5 mg total) by mouth daily.    Dispense:  90 tablet    Refill:  1

## 2022-05-24 NOTE — Patient Instructions (Signed)
will add Amlodipine 2.'5mg'$  once daily . Patient should schedule for a one month follow up  ( nurse visit ) . Patient informed that  wrist cuff not as accurate as bicep cuff. He will purchase a bicep cuff before next nurse visit and will bring in with him to the appointment.

## 2022-06-21 ENCOUNTER — Ambulatory Visit (INDEPENDENT_AMBULATORY_CARE_PROVIDER_SITE_OTHER): Payer: 59 | Admitting: Family Medicine

## 2022-06-21 ENCOUNTER — Telehealth: Payer: Self-pay | Admitting: *Deleted

## 2022-06-21 VITALS — BP 139/78 | HR 64 | Ht 70.0 in

## 2022-06-21 DIAGNOSIS — I10 Essential (primary) hypertension: Secondary | ICD-10-CM | POA: Diagnosis not present

## 2022-06-21 NOTE — Telephone Encounter (Signed)
Pt called and lvm stating that he would like to stay on what he is currently taking.

## 2022-06-21 NOTE — Progress Notes (Unsigned)
Agree with above.  I think just doing a combo medic medication would be much better.  He would like to stay on separate medication. Will inc amlodipine to 5mg .   F/U for nurse vist in 1 mo    Meds ordered this encounter  Medications   amLODipine (NORVASC) 5 MG tablet    Sig: Take 1 tablet (5 mg total) by mouth daily.    Dispense:  90 tablet    Refill:  1    Please d/c any refills for 2.5 mg

## 2022-06-21 NOTE — Progress Notes (Unsigned)
   Established Patient Office Visit  Subjective   Patient ID: Demarquis Braganza, male    DOB: [birth year]  Age: 62 y.o. MRN: [id number]  Chief Complaint  Patient presents with   Hypertension    BP check - nurse visit.    HPI  Hypertension- BP check nurse visit. Patient denies chest pain, shortness of breath, dizziness, palpitations or problems with medications including new medication , Amlodipine added at last nurse visit.. Patient requesting to reduce Lisinopril 40mg  back down to 20 mg. Patient brought  in home BP cuff for review ( wrist cuff) patient states he is trying to get a bicep home cuff but has not yet been able to afford it.. Home wrist BP cuff readings = 122/78,124/81,122/77,121/72,112/68,137/77,113/72,129/76,117/72,131/75,113/72. Patient states he checks his BP at home 1 to 2 times per day.   ROS    Objective:     BP (!) 144/86 (BP Location: Left Arm, Patient Position: Sitting, Cuff Size: Normal)   Pulse 64   Ht 5\' 10"  (1.778 m)   SpO2 100%   BMI 29.13 kg/m  {Vitals History (Optional):23777}  Physical Exam   No results found for any visits on 06/21/22.  {Labs (Optional):23779}  The 10-year ASCVD risk score (Arnett DK, et al., 2019) is: 8.7%    Assessment & Plan:  BP check - nurse visit. Initial reading= 144/86 ( home wrist cuff = 144/84). Second reading = 139/78 ( home wrist cuff = 124/84) . Numbers reviewed by Dr. Madilyn Fireman. She states she can not reduce Lisinopril back down to 20mg   and is going to add amlodipine 5mg  . She states she could prescribe Exforge combination pill if patient is just wanting to take one tablet per day. Called patient and requesting a return call to see how to proceed with medications.  Problem List Items Addressed This Visit   None   No follow-ups on file.    Rae Lips, LPN

## 2022-06-21 NOTE — Patient Instructions (Signed)
Return in 6 weeks for nurse visit for BP check .

## 2022-06-22 ENCOUNTER — Ambulatory Visit: Payer: 59

## 2022-06-22 MED ORDER — AMLODIPINE BESYLATE 5 MG PO TABS: 5.0000 mg | ORAL_TABLET | Freq: Every day | ORAL | 1 refills | Status: DC

## 2022-12-07 IMAGING — CT CT HEART MORP W/ CTA COR W/ SCORE W/ CA W/CM &/OR W/O CM
4 of 7 series · 8 of 20 positions shown, 9 images · IV contrast (APPLIED)
Comparison: None.

Addendum:
CLINICAL DATA: 60 yo male with chest pain

EXAM:
Cardiac/Coronary CTA
TECHNIQUE: A non-contrast, gated CT scan was obtained with axial slices of 3 mm
through the heart for calcium scoring. Calcium scoring was performed
using the Agatston method. A 120 kV prospective, gated, contrast
cardiac scan was obtained. Gantry rotation speed was 250 msecs and
collimation was 0.6 mm. Two sublingual nitroglycerin tablets (0.8
mg) were given. The 3D data set was reconstructed in 5% intervals of
the 35-75% of the R-R cycle. Diastolic phases were analyzed on a
dedicated workstation using MPR, MIP, and VRT modes. The patient
received 95 cc of contrast.

[Series 6: best diast · axial · 0.39mm/px · z∈[-283,-244]mm · 2 of 296 slices shown, 3 images]
[im 99/296  vessel]
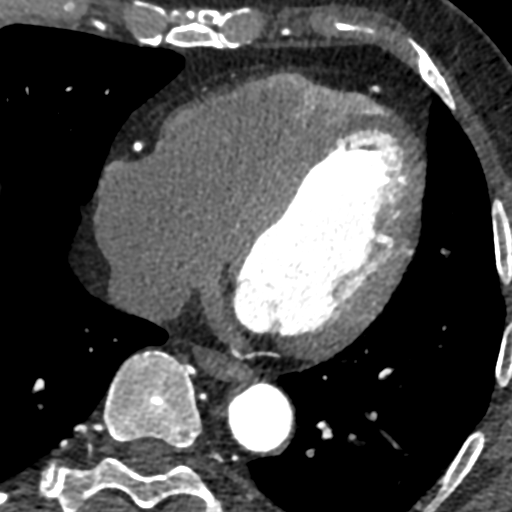
[im 99/296  lung]
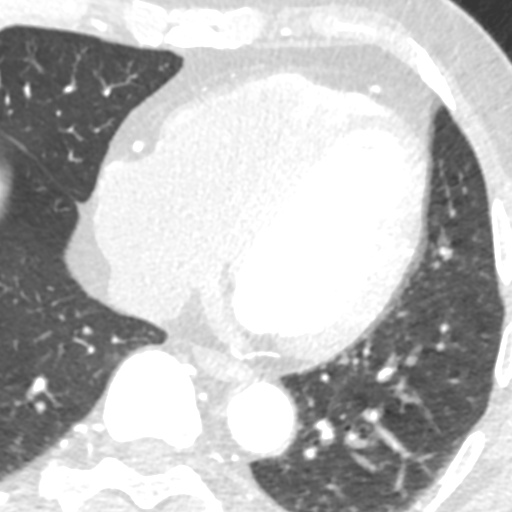
[im 197/296  vessel]
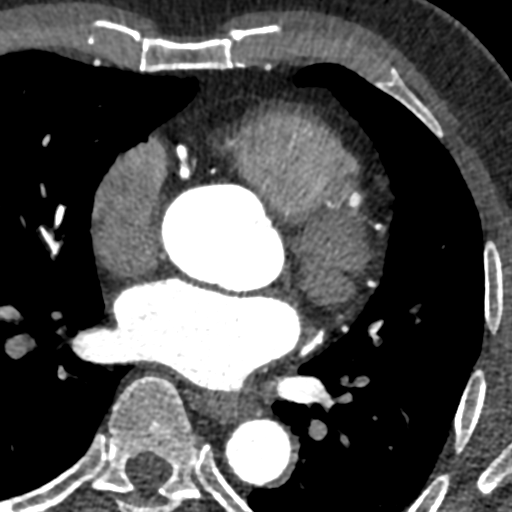

[Series 7: best syst · axial · 0.39mm/px · z∈[-283,-244]mm · 2 of 296 slices shown]
[im 99/296  vessel]
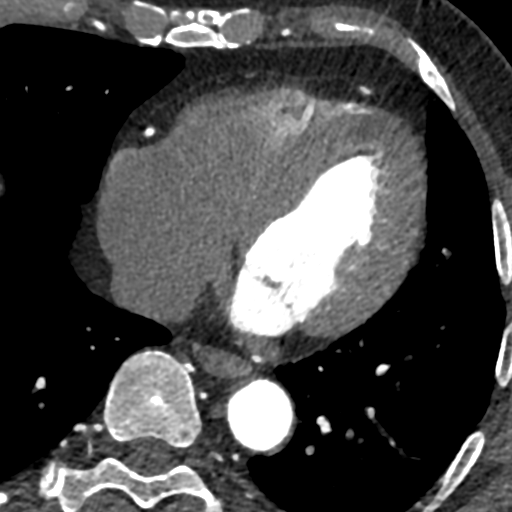
[im 197/296  vessel]
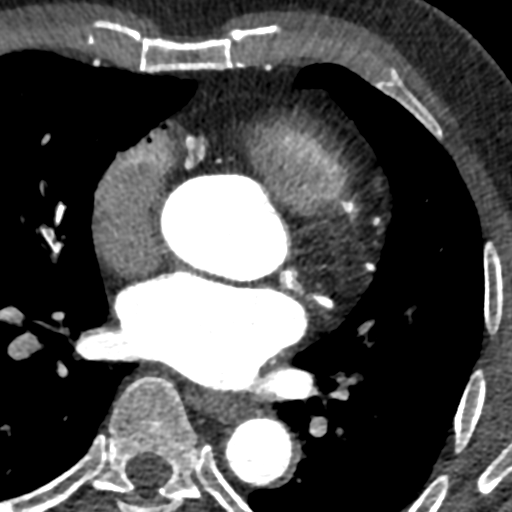

[Series 8: ts diast sharp · axial · 0.39mm/px · z∈[-283,-244]mm · 2 of 296 slices shown]
[im 99/296  lung]
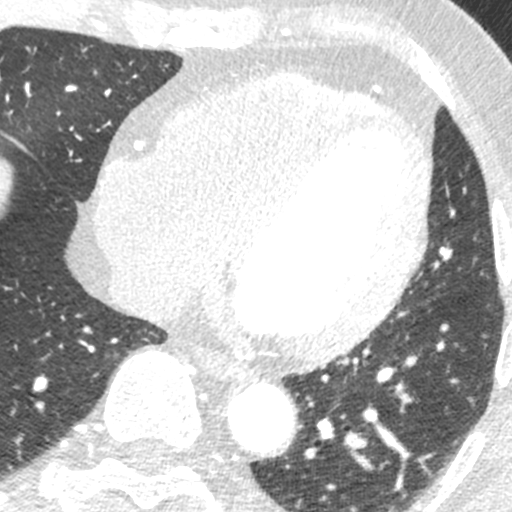
[im 197/296  lung]
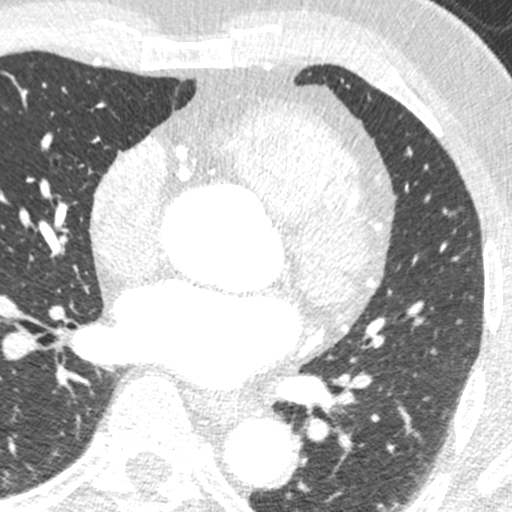

[Series 9: ts syst sharp · axial · 0.39mm/px · z∈[-283,-244]mm · 2 of 296 slices shown]
[im 99/296  lung]
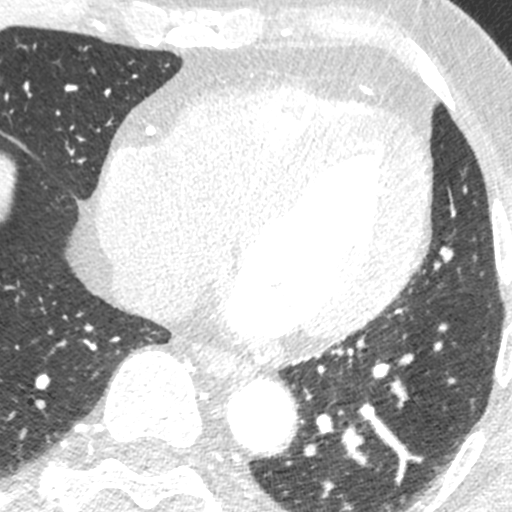
[im 197/296  lung]
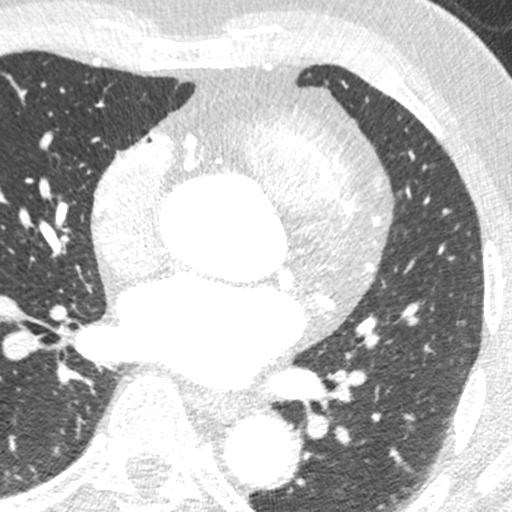

[8 of 20 positions shown; findings below may reference images not displayed]

FINDINGS: Image quality: Excellent.

Noise artifact is: Limited.

Coronary Arteries:  Normal coronary origin.  Right dominance.

Left main: The left main is a large caliber vessel with a normal
take off from the left coronary cusp that bifurcates to form a left
anterior descending artery and a left circumflex artery. There is no
plaque or stenosis.

Left anterior descending artery: The LAD is patent without evidence
of plaque or stenosis. The LAD gives off 2 patent diagonal branches.

Left circumflex artery: The LCX is non-dominant and patent with no
evidence of plaque or stenosis. The LCX gives off medium size OM1
and small OM2 and OM3; terminates in posterolateral. No plaque
noted.

Right coronary artery: The RCA is dominant with normal take off from
the right coronary cusp. There is no evidence of plaque or stenosis.
The RCA terminates as a PDA and right posterolateral branch without
evidence of plaque or stenosis.

Right Atrium: Right atrial size is within normal limits.

Right Ventricle: The right ventricular cavity is within normal
limits.

Left Atrium: Left atrial size is normal in size with no left atrial
appendage filling defect.

Left Ventricle: The ventricular cavity size is within normal limits.
There are no stigmata of prior infarction. There is no abnormal
filling defect.

Pulmonary arteries: Normal in size without proximal filling defect.

Pulmonary veins: Normal pulmonary venous drainage.

Pericardium: Normal thickness with no significant effusion or
calcium present.

Cardiac valves: The aortic valve is trileaflet without significant
calcification. The mitral valve is normal structure without
significant calcification.

Aorta: Mildly dilated aortic root (40 mm); no significant disease.

Extra-cardiac findings: See attached radiology report for
non-cardiac structures.
IMPRESSION: 1. Coronary calcium score of 0. This was 0 percentile for age-, sex,
and race-matched controls.

2. Normal coronary origin with right dominance.

3. No evidence of CAD.

4. Mildly dilated aortic root (4 cm).

RECOMMENDATIONS:
CAD-RADS 0: No evidence of CAD (0%). Consider non-atherosclerotic
causes of chest pain.

EXAM:
OVER-READ INTERPRETATION  CT CHEST

The following report is an over-read performed by radiologist Dr.
does not include interpretation of cardiac or coronary anatomy or
pathology. The coronary CTA interpretation by the cardiologist is
attached.
FINDINGS: No mediastinal mass or adenopathy identified. Visualized portions of
the lower airway and esophagus are unremarkable.

No pleural effusion, airspace consolidation, or atelectasis. No
suspicious lung nodule.

No acute findings within the imaged portions of the upper abdomen.

The visualized osseous structures are unremarkable. No acute or
suspicious osseous findings.
IMPRESSION: No significant noncardiac supplemental findings identified.

*** End of Addendum ***
FINDINGS: Image quality: Excellent.

Noise artifact is: Limited.

Coronary Arteries:  Normal coronary origin.  Right dominance.

Left main: The left main is a large caliber vessel with a normal
take off from the left coronary cusp that bifurcates to form a left
anterior descending artery and a left circumflex artery. There is no
plaque or stenosis.

Left anterior descending artery: The LAD is patent without evidence
of plaque or stenosis. The LAD gives off 2 patent diagonal branches.

Left circumflex artery: The LCX is non-dominant and patent with no
evidence of plaque or stenosis. The LCX gives off medium size OM1
and small OM2 and OM3; terminates in posterolateral. No plaque
noted.

Right coronary artery: The RCA is dominant with normal take off from
the right coronary cusp. There is no evidence of plaque or stenosis.
The RCA terminates as a PDA and right posterolateral branch without
evidence of plaque or stenosis.

Right Atrium: Right atrial size is within normal limits.

Right Ventricle: The right ventricular cavity is within normal
limits.

Left Atrium: Left atrial size is normal in size with no left atrial
appendage filling defect.

Left Ventricle: The ventricular cavity size is within normal limits.
There are no stigmata of prior infarction. There is no abnormal
filling defect.

Pulmonary arteries: Normal in size without proximal filling defect.

Pulmonary veins: Normal pulmonary venous drainage.

Pericardium: Normal thickness with no significant effusion or
calcium present.

Cardiac valves: The aortic valve is trileaflet without significant
calcification. The mitral valve is normal structure without
significant calcification.

Aorta: Mildly dilated aortic root (40 mm); no significant disease.

Extra-cardiac findings: See attached radiology report for
non-cardiac structures.
IMPRESSION: 1. Coronary calcium score of 0. This was 0 percentile for age-, sex,
and race-matched controls.

2. Normal coronary origin with right dominance.

3. No evidence of CAD.

4. Mildly dilated aortic root (4 cm).

RECOMMENDATIONS:
CAD-RADS 0: No evidence of CAD (0%). Consider non-atherosclerotic
causes of chest pain.

## 2022-12-23 ENCOUNTER — Encounter: Payer: Self-pay | Admitting: Family Medicine

## 2022-12-23 ENCOUNTER — Ambulatory Visit (INDEPENDENT_AMBULATORY_CARE_PROVIDER_SITE_OTHER): Payer: 59 | Admitting: Family Medicine

## 2022-12-23 VITALS — BP 130/78 | HR 71 | Ht 70.0 in | Wt 202.0 lb

## 2022-12-23 DIAGNOSIS — Z125 Encounter for screening for malignant neoplasm of prostate: Secondary | ICD-10-CM

## 2022-12-23 DIAGNOSIS — E785 Hyperlipidemia, unspecified: Secondary | ICD-10-CM | POA: Diagnosis not present

## 2022-12-23 DIAGNOSIS — Z0000 Encounter for general adult medical examination without abnormal findings: Secondary | ICD-10-CM | POA: Diagnosis not present

## 2022-12-23 DIAGNOSIS — I10 Essential (primary) hypertension: Secondary | ICD-10-CM | POA: Diagnosis not present

## 2022-12-23 NOTE — Addendum Note (Signed)
Addended by: Deno Etienne on: 12/23/2022 03:59 PM   Modules accepted: Orders

## 2022-12-23 NOTE — Patient Instructions (Signed)
We can also refer your to [facility], Nose [facility] Thro[facility] for hearing testing.

## 2022-12-24 LAB — CMP14+EGFR
ALT: 23 [IU]/L (ref 0–44)
AST: 30 [IU]/L (ref 0–40)
Albumin: 4.5 g/dL (ref 3.8–4.9)
Alkaline Phosphatase: 59 [IU]/L (ref 44–121)
BUN/Creatinine Ratio: 27 — ABNORMAL HIGH (ref 9–20)
BUN: 24 mg/dL (ref 6–24)
Bilirubin Total: 0.3 mg/dL (ref 0.0–1.2)
CO2: 24 mmol/L (ref 20–29)
Calcium: 9.6 mg/dL (ref 8.7–10.2)
Chloride: 103 mmol/L (ref 96–106)
Creatinine, Ser: 0.89 mg/dL (ref 0.76–1.27)
Globulin, Total: 2.4 g/dL (ref 1.5–4.5)
Glucose: 98 mg/dL (ref 70–99)
Potassium: 4.5 mmol/L (ref 3.5–5.2)
Sodium: 140 mmol/L (ref 134–144)
Total Protein: 6.9 g/dL (ref 6.0–8.5)
eGFR: 99 mL/min/{1.73_m2} (ref >59)

## 2022-12-24 LAB — CBC WITH DIFFERENTIAL/PLATELET
Basophils Absolute: 0 10*3/uL (ref 0.0–0.2)
Basos: 1 %
EOS (ABSOLUTE): 0.1 10*3/uL (ref 0.0–0.4)
Eos: 2 %
Hematocrit: 44.6 % (ref 37.5–51.0)
Hemoglobin: 14.9 g/dL (ref 13.0–17.7)
Immature Grans (Abs): 0 10*3/uL (ref 0.0–0.1)
Immature Granulocytes: 0 %
Lymphocytes Absolute: 1.9 10*3/uL (ref 0.7–3.1)
Lymphs: 41 %
MCH: 29.5 pg (ref 26.6–33.0)
MCHC: 33.4 g/dL (ref 31.5–35.7)
MCV: 88 fL (ref 79–97)
Monocytes Absolute: 0.5 10*3/uL (ref 0.1–0.9)
Monocytes: 10 %
Neutrophils Absolute: 2.2 10*3/uL (ref 1.4–7.0)
Neutrophils: 46 %
Platelets: 246 10*3/uL (ref 150–450)
RBC: 5.05 x10E6/uL (ref 4.14–5.80)
RDW: 11.9 % (ref 11.6–15.4)
WBC: 4.7 10*3/uL (ref 3.4–10.8)

## 2022-12-24 LAB — LIPID PANEL WITH LDL/HDL RATIO
Cholesterol, Total: 152 mg/dL (ref 100–199)
HDL: 40 mg/dL (ref >39)
LDL Chol Calc (NIH): 57 mg/dL (ref 0–99)
LDL/HDL Ratio: 1.4 {ratio} (ref 0.0–3.6)
Triglycerides: 356 mg/dL — ABNORMAL HIGH (ref 0–149)
VLDL Cholesterol Cal: 55 mg/dL — ABNORMAL HIGH (ref 5–40)

## 2022-12-24 LAB — PSA: Prostate Specific Ag, Serum: 0.9 ng/mL (ref 0.0–4.0)

## 2022-12-24 NOTE — Progress Notes (Signed)
Your LDL looks great on your cholesterol levels.  Triglycerides are still up a little bit so continue to work on try to get in some regular exercise you are doing great with the walking so just try to keep that up and continue work on healthy food choices.  Your metabolic panel overall looks great.  Blood count is normal no sign of anemia.  Prostate test is normal as well.

## 2023-02-02 ENCOUNTER — Other Ambulatory Visit: Payer: Self-pay | Admitting: Family Medicine

## 2023-06-24 ENCOUNTER — Ambulatory Visit (INDEPENDENT_AMBULATORY_CARE_PROVIDER_SITE_OTHER): Payer: 59 | Admitting: Family Medicine

## 2023-06-24 ENCOUNTER — Encounter: Payer: Self-pay | Admitting: Family Medicine

## 2023-06-24 VITALS — BP 128/74 | HR 66 | Ht 70.0 in | Wt 206.0 lb

## 2023-06-24 DIAGNOSIS — I10 Essential (primary) hypertension: Secondary | ICD-10-CM | POA: Diagnosis not present

## 2023-06-24 DIAGNOSIS — E785 Hyperlipidemia, unspecified: Secondary | ICD-10-CM | POA: Diagnosis not present

## 2023-06-24 MED ORDER — LISINOPRIL 40 MG PO TABS: 40.0000 mg | ORAL_TABLET | Freq: Every day | ORAL | 1 refills | Status: DC

## 2023-06-24 NOTE — Progress Notes (Signed)
   Established Patient Office Visit  Subjective  Patient ID: Alan Rodriguez, male    DOB: [birth year]  Age: 63 y.o. MRN: [id number]  Chief Complaint  Patient presents with   Hypertension    HPI   Hypertension- Pt denies chest pain, SOB, dizziness, or heart palpitations.  He says he is really not taking the amlodipine but he is taking lisinopril last time he was here he was just taking it if his blood pressure was high but since then he has been taking it every day and taking it consistently.  He said that his blood pressures have been in the 120s and sometimes in the teens.  He is otherwise felt well with the medicine and not had any problems or concerns.  Denies medication side effects.    He wanted to know if he could donate blood while on his medications and I let him know that he could.      ROS    Objective:     BP 128/74   Pulse 66   Ht 5\' 10"  (1.778 m)   Wt 206 lb (93.4 kg)   SpO2 95%   BMI 29.56 kg/m    Physical Exam Vitals and nursing note reviewed.  Constitutional:      Appearance: Normal appearance.  HENT:     Head: Normocephalic and atraumatic.  Eyes:     Conjunctiva/sclera: Conjunctivae normal.  Cardiovascular:     Rate and Rhythm: Normal rate and regular rhythm.  Pulmonary:     Effort: Pulmonary effort is normal.     Breath sounds: Normal breath sounds.  Skin:    General: Skin is warm and dry.  Neurological:     Mental Status: He is alert.  Psychiatric:        Mood and Affect: Mood normal.      No results found for any visits on 06/24/23.    The 10-year ASCVD risk score (Arnett DK, et al., 2019) is: 9.2%    Assessment & Plan:   Problem List Items Addressed This Visit       Cardiovascular and Mediastinum   Essential hypertension - Primary   Well-controlled he gets better numbers at home.  He would really like to try splitting it back down to 20 mg he eats healthy he rarely eats fast food or processed foods he eats low salt.  He stays  active.  Due for CMP.      Relevant Medications   lisinopril (ZESTRIL) 40 MG tablet   Other Relevant Orders   CMP14+EGFR     Other   Hyperlipidemia   Last LDL in September was at goal at 25.  Will recheck liver function today.      Relevant Medications   lisinopril (ZESTRIL) 40 MG tablet   Other Relevant Orders   CMP14+EGFR    Return in about 6 months (around 12/25/2023) for Wellness Exam.    Nani Gasser, MD

## 2023-06-24 NOTE — Assessment & Plan Note (Signed)
 Well-controlled he gets better numbers at home.  He would really like to try splitting it back down to 20 mg he eats healthy he rarely eats fast food or processed foods he eats low salt.  He stays active.  Due for CMP.

## 2023-06-24 NOTE — Assessment & Plan Note (Signed)
 Last LDL in September was at goal at 54.  Will recheck liver function today.

## 2023-06-25 LAB — SPECIMEN STATUS REPORT

## 2023-06-25 LAB — CMP14+EGFR
ALT: 39 IU/L (ref 0–44)
AST: 42 IU/L — ABNORMAL HIGH (ref 0–40)
Albumin: 4.7 g/dL (ref 3.8–4.9)
Alkaline Phosphatase: 64 IU/L (ref 44–121)
BUN/Creatinine Ratio: 21 (ref 10–24)
BUN: 21 mg/dL (ref 8–27)
Bilirubin Total: 0.5 mg/dL (ref 0.0–1.2)
CO2: 22 mmol/L (ref 20–29)
Calcium: 10 mg/dL (ref 8.6–10.2)
Chloride: 102 mmol/L (ref 96–106)
Creatinine, Ser: 0.98 mg/dL (ref 0.76–1.27)
Globulin, Total: 2.4 g/dL (ref 1.5–4.5)
Glucose: 96 mg/dL (ref 70–99)
Potassium: 4.9 mmol/L (ref 3.5–5.2)
Sodium: 139 mmol/L (ref 134–144)
Total Protein: 7.1 g/dL (ref 6.0–8.5)
eGFR: 88 mL/min/{1.73_m2} (ref >59)

## 2023-06-27 ENCOUNTER — Other Ambulatory Visit: Payer: Self-pay | Admitting: *Deleted

## 2023-06-27 ENCOUNTER — Encounter: Payer: Self-pay | Admitting: Family Medicine

## 2023-06-27 DIAGNOSIS — R748 Abnormal levels of other serum enzymes: Secondary | ICD-10-CM

## 2023-06-27 NOTE — Progress Notes (Signed)
 Hi Sheriff, your AST liver enzyme is a little high.  Work on cutting back on tylenol and alcohol products and plan to recheck in 6 weeks.

## 2023-07-14 ENCOUNTER — Other Ambulatory Visit: Payer: Self-pay | Admitting: Family Medicine

## 2023-12-26 ENCOUNTER — Ambulatory Visit (INDEPENDENT_AMBULATORY_CARE_PROVIDER_SITE_OTHER): Payer: 59 | Admitting: Family Medicine

## 2023-12-26 ENCOUNTER — Encounter: Payer: Self-pay | Admitting: Family Medicine

## 2023-12-26 VITALS — BP 141/84 | HR 67 | Temp 98.5°F | Ht 70.0 in | Wt 204.0 lb

## 2023-12-26 DIAGNOSIS — Z0000 Encounter for general adult medical examination without abnormal findings: Secondary | ICD-10-CM

## 2023-12-26 DIAGNOSIS — I10 Essential (primary) hypertension: Secondary | ICD-10-CM

## 2023-12-26 DIAGNOSIS — E785 Hyperlipidemia, unspecified: Secondary | ICD-10-CM | POA: Diagnosis not present

## 2023-12-26 NOTE — Progress Notes (Signed)
 Complete physical exam  Patient: Alan Rodriguez   DOB: [birth year]   62 y.o. Male  MRN: [id number]  Subjective:    Chief Complaint  Patient presents with   Annual Exam    Alan Rodriguez is a 63 y.o. male who presents today for a complete physical exam. He reports consuming a general diet. Some exercise  He generally feels well. He reports sleeping fair, usually about 5 hours per night. Still works different shift. . He does not have additional problems to discuss today.    Most recent fall risk assessment:    06/24/2023    4:15 PM  Fall Risk   Falls in the past year? 0  Number falls in past yr: 0  Injury with Fall? 0  Risk for fall due to : No Fall Risks  Follow up Falls evaluation completed     Most recent depression screenings:    12/26/2023    3:43 PM 06/24/2023    4:15 PM  PHQ 2/9 Scores  PHQ - 2 Score 0 0         Patient Care Team: Alvan Dorothyann BIRCH, MD as PCP - General (Family Medicine)   Outpatient Medications Prior to Visit  Medication Sig   atorvastatin  (LIPITOR) 20 MG tablet TAKE 1 TABLET BY MOUTH AT BEDTIME   lisinopril  (ZESTRIL ) 40 MG tablet Take 1 tablet (40 mg total) by mouth daily. (Patient taking differently: Take 1 tablet (40 mg total) by mouth daily.)   [DISCONTINUED] amLODipine  (NORVASC ) 5 MG tablet TAKE 1 TABLET BY MOUTH DAILY (Patient not taking: Reported on 12/26/2023)   No facility-administered medications prior to visit.    ROS        Objective:     BP (!) 141/84 (BP Location: Right Arm, Patient Position: Sitting, Cuff Size: Normal)   Pulse 67   Temp 98.5 F (36.9 C) (Oral)   Ht 5' 10 (1.778 m)   Wt 204 lb (92.5 kg)   SpO2 100%   BMI 29.27 kg/m     Physical Exam Constitutional:      Appearance: Normal appearance.  HENT:     Head: Normocephalic and atraumatic.     Right Ear: Tympanic membrane, ear canal and external ear normal.     Left Ear: Tympanic membrane, ear canal and external ear normal.     Nose: Nose  normal.     Mouth/Throat:     Pharynx: Oropharynx is clear.  Eyes:     Extraocular Movements: Extraocular movements intact.     Conjunctiva/sclera: Conjunctivae normal.     Pupils: Pupils are equal, round, and reactive to light.  Neck:     Thyroid: No thyromegaly.  Cardiovascular:     Rate and Rhythm: Normal rate and regular rhythm.  Pulmonary:     Effort: Pulmonary effort is normal.     Breath sounds: Normal breath sounds.  Abdominal:     General: Bowel sounds are normal.     Palpations: Abdomen is soft.     Tenderness: There is no abdominal tenderness.  Musculoskeletal:        General: No swelling.     Cervical back: Neck supple.  Skin:    General: Skin is warm and dry.  Neurological:     Mental Status: He is oriented to person, place, and time.  Psychiatric:        Mood and Affect: Mood normal.        Behavior: Behavior normal.      No results  found for any visits on 12/26/23.      Assessment & Plan:    Routine Health Maintenance and Physical Exam  Immunization History  Administered Date(s) Administered   Rabies, IM 12/30/2019, 01/02/2020, 01/05/2020, 01/12/2020, 01/26/2020   Tdap 12/30/2019    Health Maintenance  Topic Date Due   HIV Screening  Never done   Hepatitis C Screening  Never done   Zoster Vaccines- Shingrix (1 of 2) Never done   Pneumococcal Vaccine: 50+ Years (1 of 1 - PCV) Never done   Colonoscopy  03/29/2024   COVID-19 Vaccine (1) 01/11/2024 (Originally 02/19/1966)   Influenza Vaccine  06/26/2024 (Originally 10/28/2023)   DTaP/Tdap/Td (2 - Td or Tdap) 12/29/2029   Hepatitis B Vaccines 19-59 Average Risk  Aged Out   HPV VACCINES  Aged Out   Meningococcal B Vaccine  Aged Out    Discussed health benefits of physical activity, and encouraged him to engage in regular exercise appropriate for his age and condition.  Problem List Items Addressed This Visit       Cardiovascular and Mediastinum   Essential hypertension   Relevant Orders    CMP14+EGFR   Lipid panel   CBC   PSA     Other   Hyperlipidemia   Relevant Orders   Lipid panel   Other Visit Diagnoses       Wellness examination    -  Primary   Relevant Orders   CMP14+EGFR   Lipid panel   CBC   PSA       Wellness exam performed today will get updated labs. He did decline flu vaccine.  Did discuss pneumonia vaccine he said he might consider it at the next office visit.  He has only taken half a tab of his blood pressure pill but says that that has been working really well for him.  He says he does check his blood pressures at home and gets great numbers at home will continue to monitor did go to remove the amlodipine  from his medication list.     Return in about 6 months (around 06/24/2024) for Hypertension.     Dorothyann Byars, MD

## 2023-12-27 ENCOUNTER — Ambulatory Visit: Payer: Self-pay | Admitting: Family Medicine

## 2023-12-27 LAB — CMP14+EGFR
ALT: 27 IU/L (ref 0–44)
AST: 31 IU/L (ref 0–40)
Albumin: 4.6 g/dL (ref 3.8–4.9)
Alkaline Phosphatase: 58 IU/L (ref 47–123)
BUN/Creatinine Ratio: 21 (ref 10–24)
BUN: 18 mg/dL (ref 8–27)
Bilirubin Total: 0.5 mg/dL (ref 0.0–1.2)
CO2: 22 mmol/L (ref 20–29)
Calcium: 9.8 mg/dL (ref 8.6–10.2)
Chloride: 102 mmol/L (ref 96–106)
Creatinine, Ser: 0.87 mg/dL (ref 0.76–1.27)
Globulin, Total: 2.3 g/dL (ref 1.5–4.5)
Glucose: 100 mg/dL — ABNORMAL HIGH (ref 70–99)
Potassium: 4.7 mmol/L (ref 3.5–5.2)
Sodium: 138 mmol/L (ref 134–144)
Total Protein: 6.9 g/dL (ref 6.0–8.5)
eGFR: 99 mL/min/1.73 (ref >59)

## 2023-12-27 LAB — CBC
Hematocrit: 46.1 % (ref 37.5–51.0)
Hemoglobin: 15.4 g/dL (ref 13.0–17.7)
MCH: 30 pg (ref 26.6–33.0)
MCHC: 33.4 g/dL (ref 31.5–35.7)
MCV: 90 fL (ref 79–97)
Platelets: 254 x10E3/uL (ref 150–450)
RBC: 5.14 x10E6/uL (ref 4.14–5.80)
RDW: 12.4 % (ref 11.6–15.4)
WBC: 5.5 x10E3/uL (ref 3.4–10.8)

## 2023-12-27 LAB — LIPID PANEL
Chol/HDL Ratio: 3.8 ratio (ref 0.0–5.0)
Cholesterol, Total: 182 mg/dL (ref 100–199)
HDL: 48 mg/dL (ref >39)
LDL Chol Calc (NIH): 95 mg/dL (ref 0–99)
Triglycerides: 229 mg/dL — ABNORMAL HIGH (ref 0–149)
VLDL Cholesterol Cal: 39 mg/dL (ref 5–40)

## 2023-12-27 LAB — PSA: Prostate Specific Ag, Serum: 0.9 ng/mL (ref 0.0–4.0)

## 2023-12-27 NOTE — Progress Notes (Signed)
 Hi Rocklin,  your AST is better. Your triglycerides look better but still a little high.  Blood count and prostate tests are normal.

## 2024-01-24 ENCOUNTER — Telehealth: Payer: Self-pay

## 2024-01-24 NOTE — Telephone Encounter (Signed)
LM for pt to return call to schedule.

## 2024-01-24 NOTE — Telephone Encounter (Signed)
 Yes ok for Shingles and Prevnar 20

## 2024-01-30 ENCOUNTER — Telehealth: Payer: Self-pay

## 2024-01-30 NOTE — Telephone Encounter (Signed)
 Is it o.k. to schedule this patient as  Shingles and pneumonia vaccine appt as nurse visit?

## 2024-01-30 NOTE — Telephone Encounter (Signed)
 There is already phone note about this from 10/28, looks like he was not contacted back.  Yes okay to schedule for both.

## 2024-01-30 NOTE — Telephone Encounter (Signed)
 Copied from CRM [id number]. Topic: Appointments - Scheduling Inquiry for Clinic >> Jan 30, 2024 12:00 PM Anairis L wrote: Reason for CRM: Shingles and pneumonia vaccine app. Please call son Charlynne [phone]

## 2024-01-31 NOTE — Telephone Encounter (Signed)
 Patient scheduled for 02/10/2024 for nurse visit for immunizations

## 2024-02-10 ENCOUNTER — Ambulatory Visit (INDEPENDENT_AMBULATORY_CARE_PROVIDER_SITE_OTHER)

## 2024-02-10 DIAGNOSIS — Z23 Encounter for immunization: Secondary | ICD-10-CM

## 2024-04-27 ENCOUNTER — Other Ambulatory Visit: Payer: Self-pay | Admitting: Family Medicine

## 2024-04-27 DIAGNOSIS — I10 Essential (primary) hypertension: Secondary | ICD-10-CM

## 2024-06-25 ENCOUNTER — Ambulatory Visit: Admitting: Family Medicine

## 2024-06-26 ENCOUNTER — Ambulatory Visit: Admitting: Family Medicine
